# Patient Record
Sex: Male | Born: 1955 | Race: White | Hispanic: Refuse to answer | Marital: Married | Smoking: Never smoker
Health system: Southern US, Community
[De-identification: ages and names within clinical notes are randomized; demographics above are authoritative.]

## PROBLEM LIST (undated history)

## (undated) DIAGNOSIS — F419 Anxiety disorder, unspecified: Secondary | ICD-10-CM

## (undated) DIAGNOSIS — F410 Panic disorder [episodic paroxysmal anxiety] without agoraphobia: Secondary | ICD-10-CM

## (undated) DIAGNOSIS — C801 Malignant (primary) neoplasm, unspecified: Secondary | ICD-10-CM

## (undated) DIAGNOSIS — I10 Essential (primary) hypertension: Secondary | ICD-10-CM

## (undated) HISTORY — PX: TONSILLECTOMY: SUR1361

---

## 2006-01-05 ENCOUNTER — Ambulatory Visit: Payer: Self-pay | Admitting: Otolaryngology

## 2006-01-15 ENCOUNTER — Ambulatory Visit: Payer: Self-pay | Admitting: Otolaryngology

## 2006-01-16 ENCOUNTER — Ambulatory Visit: Payer: Self-pay | Admitting: Oncology

## 2006-01-17 ENCOUNTER — Ambulatory Visit: Payer: Self-pay | Admitting: Otolaryngology

## 2006-01-23 ENCOUNTER — Ambulatory Visit: Payer: Self-pay | Admitting: Oncology

## 2006-01-30 ENCOUNTER — Ambulatory Visit: Payer: Self-pay | Admitting: Surgery

## 2006-02-13 ENCOUNTER — Ambulatory Visit: Payer: Self-pay | Admitting: Orthopaedic Surgery

## 2006-02-15 ENCOUNTER — Ambulatory Visit: Payer: Self-pay | Admitting: Oncology

## 2006-03-18 ENCOUNTER — Ambulatory Visit: Payer: Self-pay | Admitting: Oncology

## 2006-04-03 ENCOUNTER — Ambulatory Visit: Payer: Self-pay | Admitting: Oncology

## 2006-04-18 ENCOUNTER — Ambulatory Visit: Payer: Self-pay | Admitting: Oncology

## 2006-05-18 ENCOUNTER — Ambulatory Visit: Payer: Self-pay | Admitting: Oncology

## 2006-06-18 ENCOUNTER — Ambulatory Visit: Payer: Self-pay | Admitting: Oncology

## 2006-07-18 ENCOUNTER — Ambulatory Visit: Payer: Self-pay | Admitting: Oncology

## 2006-08-14 ENCOUNTER — Ambulatory Visit: Payer: Self-pay | Admitting: Oncology

## 2006-08-27 ENCOUNTER — Ambulatory Visit: Payer: Self-pay | Admitting: Oncology

## 2006-09-18 ENCOUNTER — Ambulatory Visit: Payer: Self-pay | Admitting: Oncology

## 2006-10-28 ENCOUNTER — Ambulatory Visit: Payer: Self-pay | Admitting: Radiation Oncology

## 2006-11-17 ENCOUNTER — Ambulatory Visit: Payer: Self-pay | Admitting: Radiation Oncology

## 2006-11-25 ENCOUNTER — Ambulatory Visit: Payer: Self-pay | Admitting: Oncology

## 2006-12-17 ENCOUNTER — Ambulatory Visit: Payer: Self-pay | Admitting: Radiation Oncology

## 2006-12-17 ENCOUNTER — Ambulatory Visit: Payer: Self-pay | Admitting: Oncology

## 2007-02-16 ENCOUNTER — Ambulatory Visit: Payer: Self-pay | Admitting: Oncology

## 2007-02-26 ENCOUNTER — Ambulatory Visit: Payer: Self-pay | Admitting: Oncology

## 2007-03-01 ENCOUNTER — Ambulatory Visit: Payer: Self-pay | Admitting: Oncology

## 2007-03-19 ENCOUNTER — Ambulatory Visit: Payer: Self-pay | Admitting: Oncology

## 2007-05-19 ENCOUNTER — Ambulatory Visit: Payer: Self-pay | Admitting: Radiation Oncology

## 2007-05-20 ENCOUNTER — Ambulatory Visit: Payer: Self-pay | Admitting: Radiation Oncology

## 2007-06-19 ENCOUNTER — Ambulatory Visit: Payer: Self-pay | Admitting: Radiation Oncology

## 2007-08-09 ENCOUNTER — Ambulatory Visit: Payer: Self-pay | Admitting: Unknown Physician Specialty

## 2007-08-09 HISTORY — PX: ESOPHAGOGASTRODUODENOSCOPY: SHX1529

## 2007-08-09 HISTORY — PX: COLONOSCOPY: SHX174

## 2007-08-16 ENCOUNTER — Ambulatory Visit: Payer: Self-pay | Admitting: Oncology

## 2007-08-19 ENCOUNTER — Ambulatory Visit: Payer: Self-pay | Admitting: Oncology

## 2007-08-23 ENCOUNTER — Ambulatory Visit: Payer: Self-pay | Admitting: Oncology

## 2007-08-25 IMAGING — CT NM PET TUM IMG RESTAG (PS) SKULL BASE T - THIGH
1 of 5 series · 1 of 25 positions shown · non-contrast
Comparison: none

REASON FOR EXAM: tonsil CA
COMMENTS:

[Series 3: ct wb fusion · axial · 4.0mm · 0.98mm/px · 1 of 488 slices shown]
[im 443/488  brain]
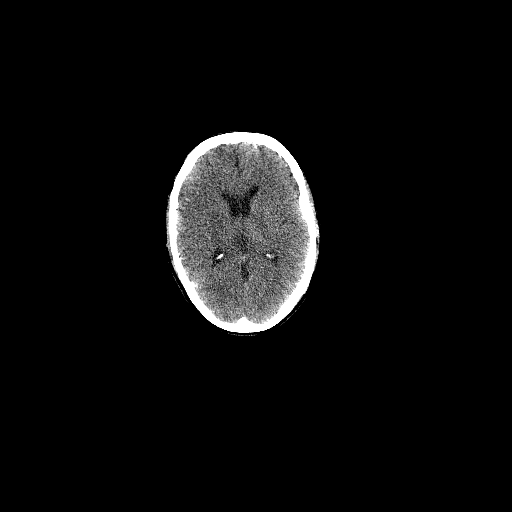

[1 of 25 positions shown; findings below may reference images not displayed]

PROCEDURE:     PET - PET/CT RESTG HEAD/NECK CA  - February 26, 2007 [DATE]

RESULT:     The patient has a history of tonsillar malignancy. The patient
is undergoing restaging. The patient received 12.58 mCi of fluorine 18
labeled fluorodeoxyglucose for this study. Whole-body images as well as
views of the head and neck only were obtained. Co-registration with
noncontrast CT images is performed as well.

Activity over the chest, abdomen, and pelvis is normal. There are no
findings to suggest metastatic disease to these portions of the body. Along
the anterior aspect of the floor of the mouth just along the inner surface
of the mandible there is mildly increased uptake that is relatively
symmetric. This is unchanged in appearance from the prior study. Increased
uptake along the LEFT aspect of the floor of mouth and more posteriorly in
the region of the submandibular glands seen on the prior study is not
evident today. No abnormal uptake within cervical or jugular lymph nodes is
identified.  A tiny amount of uptake in the region of the vocal cords is
present and felt to be related to phonation. No abnormality in the
nasopharynx is seen. The palatine tonsillar region appears normal as does
the region of the lingual tonsils.
IMPRESSION: I do not see findings suspicious for recurrent malignancy. The mildly
increased activity along the inner aspect of the mandible is nonspecific,
not greatly changed from the prior study, and is relatively symmetric from
RIGHT to LEFT. Other areas of mildly increased uptake demonstrated on the
prior study are no longer evident.

## 2007-09-09 ENCOUNTER — Ambulatory Visit: Payer: Self-pay | Admitting: Unknown Physician Specialty

## 2007-09-09 HISTORY — PX: FLEXIBLE SIGMOIDOSCOPY: SHX5431

## 2007-09-19 ENCOUNTER — Ambulatory Visit: Payer: Self-pay | Admitting: Oncology

## 2008-01-17 ENCOUNTER — Ambulatory Visit: Payer: Self-pay | Admitting: Oncology

## 2008-03-18 ENCOUNTER — Ambulatory Visit: Payer: Self-pay | Admitting: Oncology

## 2008-03-29 ENCOUNTER — Ambulatory Visit: Payer: Self-pay | Admitting: Oncology

## 2008-04-18 ENCOUNTER — Ambulatory Visit: Payer: Self-pay | Admitting: Oncology

## 2008-05-18 ENCOUNTER — Ambulatory Visit: Payer: Self-pay | Admitting: Oncology

## 2008-05-29 ENCOUNTER — Ambulatory Visit: Payer: Self-pay | Admitting: Oncology

## 2008-06-18 ENCOUNTER — Ambulatory Visit: Payer: Self-pay | Admitting: Oncology

## 2008-08-17 ENCOUNTER — Ambulatory Visit: Payer: Self-pay | Admitting: Oncology

## 2008-08-18 ENCOUNTER — Ambulatory Visit: Payer: Self-pay | Admitting: Oncology

## 2008-08-21 ENCOUNTER — Ambulatory Visit: Payer: Self-pay | Admitting: Oncology

## 2008-09-18 ENCOUNTER — Ambulatory Visit: Payer: Self-pay | Admitting: Oncology

## 2009-02-20 ENCOUNTER — Ambulatory Visit: Payer: Self-pay | Admitting: Oncology

## 2009-03-18 ENCOUNTER — Ambulatory Visit: Payer: Self-pay | Admitting: Oncology

## 2009-06-05 DIAGNOSIS — C4491 Basal cell carcinoma of skin, unspecified: Secondary | ICD-10-CM

## 2009-06-05 HISTORY — DX: Basal cell carcinoma of skin, unspecified: C44.91

## 2009-07-18 ENCOUNTER — Ambulatory Visit: Payer: Self-pay | Admitting: Oncology

## 2009-07-23 ENCOUNTER — Ambulatory Visit: Payer: Self-pay | Admitting: Oncology

## 2009-07-25 ENCOUNTER — Ambulatory Visit: Payer: Self-pay | Admitting: Oncology

## 2009-08-18 ENCOUNTER — Ambulatory Visit: Payer: Self-pay | Admitting: Oncology

## 2010-01-16 ENCOUNTER — Ambulatory Visit: Payer: Self-pay | Admitting: Oncology

## 2010-01-21 ENCOUNTER — Ambulatory Visit: Payer: Self-pay | Admitting: Oncology

## 2010-02-15 ENCOUNTER — Ambulatory Visit: Payer: Self-pay | Admitting: Oncology

## 2010-08-05 ENCOUNTER — Ambulatory Visit: Payer: Self-pay | Admitting: Oncology

## 2010-08-18 ENCOUNTER — Ambulatory Visit: Payer: Self-pay | Admitting: Oncology

## 2011-01-29 ENCOUNTER — Ambulatory Visit: Payer: Self-pay | Admitting: Oncology

## 2011-01-30 ENCOUNTER — Ambulatory Visit: Payer: Self-pay | Admitting: Oncology

## 2011-02-16 ENCOUNTER — Ambulatory Visit: Payer: Self-pay | Admitting: Oncology

## 2011-06-27 ENCOUNTER — Ambulatory Visit: Payer: Self-pay | Admitting: Internal Medicine

## 2011-07-28 ENCOUNTER — Ambulatory Visit: Payer: Self-pay | Admitting: Oncology

## 2011-08-19 ENCOUNTER — Ambulatory Visit: Payer: Self-pay | Admitting: Oncology

## 2012-02-02 ENCOUNTER — Ambulatory Visit: Payer: Self-pay | Admitting: Emergency Medicine

## 2014-04-15 DIAGNOSIS — I10 Essential (primary) hypertension: Secondary | ICD-10-CM | POA: Insufficient documentation

## 2014-04-15 DIAGNOSIS — E039 Hypothyroidism, unspecified: Secondary | ICD-10-CM | POA: Insufficient documentation

## 2014-04-15 DIAGNOSIS — E7800 Pure hypercholesterolemia, unspecified: Secondary | ICD-10-CM | POA: Insufficient documentation

## 2014-04-15 DIAGNOSIS — F5104 Psychophysiologic insomnia: Secondary | ICD-10-CM | POA: Insufficient documentation

## 2015-09-26 DIAGNOSIS — Z8589 Personal history of malignant neoplasm of other organs and systems: Secondary | ICD-10-CM | POA: Insufficient documentation

## 2015-09-26 DIAGNOSIS — Z0000 Encounter for general adult medical examination without abnormal findings: Secondary | ICD-10-CM | POA: Insufficient documentation

## 2016-07-14 ENCOUNTER — Other Ambulatory Visit: Payer: Self-pay | Admitting: Medical

## 2016-07-14 ENCOUNTER — Ambulatory Visit
Admission: RE | Admit: 2016-07-14 | Discharge: 2016-07-14 | Disposition: A | Discharge status: Home / Self Care | Payer: BLUE CROSS/BLUE SHIELD | Source: Ambulatory Visit | Attending: Medical | Admitting: Medical

## 2016-07-14 DIAGNOSIS — R52 Pain, unspecified: Secondary | ICD-10-CM

## 2017-02-11 ENCOUNTER — Encounter: Payer: Self-pay | Admitting: Medical

## 2017-02-11 ENCOUNTER — Ambulatory Visit: Payer: Self-pay | Admitting: Medical

## 2017-02-11 VITALS — BP 146/82 | HR 68 | Temp 97.6°F | Resp 16 | Ht 69.0 in | Wt 177.2 lb

## 2017-02-11 DIAGNOSIS — H1013 Acute atopic conjunctivitis, bilateral: Secondary | ICD-10-CM

## 2017-02-11 MED ORDER — OLOPATADINE HCL 0.1 % OP SOLN: 1.0000 [drp] | Freq: Two times a day (BID) | OPHTHALMIC | 1 refills | Status: DC

## 2017-02-11 NOTE — Progress Notes (Signed)
   Subjective:    Patient ID: Jerome Joseph, male    DOB: Mar 15, 1956, 61 y.o.   MRN: [id number]  HPI 61 yo trimming bushes this weekend , now has clear, itchy eyes bilaterally and runny nose. No cough.  Does not wear contacts.   Review of Systems  Constitutional: Negative for chills and fever.  HENT: Positive for congestion. Negative for ear pain and sore throat.   Respiratory: Negative for cough.   Cardiovascular: Negative for chest pain.  Gastrointestinal: Negative for abdominal pain.  Skin: Negative for rash.       Objective:   Physical Exam  Constitutional: He appears well-developed and well-nourished.  HENT:  Head: Normocephalic and atraumatic.  Eyes: EOM and lids are normal. Pupils are equal, round, and reactive to light. Right eye exhibits no discharge and no exudate. Left eye exhibits no discharge and no exudate. Right conjunctiva is injected. Left conjunctiva is injected. No scleral icterus.  Fundoscopic exam:      The right eye shows red reflex.       The left eye shows red reflex.  Neck: Normal range of motion.  Lymphadenopathy:    He has no cervical adenopathy.  Nursing note and vitals reviewed.         Assessment & Plan:  Allergic conjunctivitis e-prescribed Patanol 0. 1 % apply one drop to each eye twice daily 6ml  1 refill. Return to the clinic in 3-5 days if not improving. Patient recommends [email or web address] for reading glasses.

## 2017-05-28 ENCOUNTER — Ambulatory Visit: Payer: Self-pay | Admitting: Adult Health

## 2017-05-28 VITALS — BP 120/80 | HR 80 | Temp 98.7°F | Resp 16 | Wt 175.0 lb

## 2017-05-28 DIAGNOSIS — L989 Disorder of the skin and subcutaneous tissue, unspecified: Secondary | ICD-10-CM

## 2017-05-28 NOTE — Patient Instructions (Signed)
Actinic Keratosis An actinic keratosis is a precancerous growth on the skin. This means that it could develop into skin cancer if it is not treated. About 1% of these growths (actinic keratoses) turn into skin cancer within one year if they are not treated. It is important to have all of these growths evaluated to determine the best treatment approach. What are the causes? This condition is caused by getting too much ultraviolet (UV) radiation from the sun or other UV light sources. What increases the risk? The following factors may make you more likely to develop this condition:  Having light-colored skin and blue eyes.  Having blonde or red hair.  Spending a lot of time in the sun.  Inadequate skin protection when outdoors. This may include: ? Not using sunscreen properly. ? Not covering up skin that is exposed to sunlight.  Aging. The risk of developing an actinic keratosis increases with age.  What are the signs or symptoms? Actinic keratoses look like scaly, rough spots of skin.They can be as small as a pinhead or as big as a quarter. They may itch, hurt, or feel sensitive. In most cases, the growths become red. In some cases, they may be skin-colored, light tan, dark tan, pink, or a combination of any of these colors. There may be a small piece of pink or gray skin (skin tag) growing from the actinic keratosis. In some cases, it may be easier to notice actinic keratoses by feeling them, rather than seeing them. Actinic keratoses appear most often on areas of skin that get a lot of sun exposure, including the scalp, face, ears, lips, upper back, forearms, and the backs of the hands. Sometimes, actinic keratoses disappear, but many reappear a few days to a few weeks later. How is this diagnosed? This condition is usually diagnosed with a physical exam. A tissue sample may be removed from the actinic keratosis and examined under a microscope (biopsy). How is this treated?  Treatment for  this condition may include:  Scraping off the actinic keratosis (curettage).  Freezing the actinic keratosis with liquid nitrogen (cryosurgery). This causes the growth to eventually fall off the skin.  Applying medicated creams or gels to destroy the cells in the growth.  Applying chemicals to the actinic keratosis to make the outer layers of skin peel off (chemical peel).  Photodynamic therapy. In this procedure, medicated cream is applied to the actinic keratosis. This cream increases your skin's sensitivity to light. Then, a strong light is aimed at the actinic keratosis to destroy cells in the growth.  Follow these instructions at home: Skin care  Apply cool, wet cloths (cool compresses) to the affected areas.  Do not scratch your skin.  Check your skin regularly for any growths, especially growths that: ? Start to itch or bleed. ? Change in size, shape, or color. Caring for the treated area  Keep the treated area clean and dry as told by your health care provider.  Do not apply any medicine, cream, or lotion to the treated area unless your health care provider tells you to do that.  Do not pick at blisters or try to break them open. This can cause infection and scarring.  If you have red or irritated skin after treatment, follow instructions from your health care provider about how to take care of the treated area. Make sure you: ? Wash your hands with soap and water before you change your bandage (dressing). If soap and water are not available, use  hand sanitizer. ? Change your dressing as told by your health care provider.  If you have red or irritated skin after treatment, check your treated area every day for signs of infection. Check for: ? Swelling, pain, or more redness. ? Fluid or blood. ? Warmth. ? Pus or a bad smell. General instructions  Take over-the-counter and prescription medicines only as told by your health care provider.  Return to your normal  activities as told by your health care provider. Ask your health care provider what activities are safe for you.  Do not use any tobacco products, such as cigarettes, chewing tobacco, and e-cigarettes. If you need help quitting, ask your health care provider.  Have a skin exam done every year by a health care provider who is a skin conditions specialist (dermatologist).  Keep all follow-up visits as told by your health care provider. This is important. How is this prevented?  Do not get sunburns.  Try to avoid the sun between 10:00 a.m. and 4:00 p.m. This is when the UV light is the strongest.  Use a sunscreen or sunblock with SPF 30 (sun protection factor 30) or greater.  Apply sunscreen before you are exposed to sunlight, and reapply periodically as often as directed by the instructions on the sunscreen container.  Always wear sunglasses that have UV protection, and always wear hats and clothing to protect your skin from sunlight.  When possible, avoid medicines that increase your sensitivity to sunlight. These include: ? Certain antibiotic medicines. ? Certain water pills (diuretics). ? Certain prescription medicines that are used to treat acne (retinoids).  Do not use tanning beds or other indoor tanning devices. Contact a health care provider if:  You notice any changes or new growths on your skin.  You have swelling, pain, or more redness around your treated area.  You have fluid or blood coming from your treated area.  Your treated area feels warm to the touch.  You have pus or a bad smell coming from your treated area.  You have a fever.  You have a blister that becomes large and painful. This information is not intended to replace advice given to you by your health care provider. Make sure you discuss any questions you have with your health care provider. Document Released: 10/31/2008 Document Revised: 04/04/2016 Document Reviewed: 04/14/2015 Elsevier Interactive  Patient Education  2018 [employer]  

## 2017-05-28 NOTE — Progress Notes (Signed)
Subjective:     Patient ID: Jerome Joseph, male   DOB: [birth year], 61 y.o.   MRN: [id number]  HPI Patient is a 61 year old male in no acute distress who presents to the clinic with a lesion left upper arm x 2 weeks. Denies any itching burning. Denies any change in size.Marland Kitchen He reports it is only sore if he touches it.  He denies any other new lesions or changing lesions.  He is a patient of Dr. Drema Dallas for dermatology for atinic keratosis.  He denies any other symptoms or concerns.   Blood pressure 120/80, pulse 80, temperature 98.7 F (37.1 C), resp. rate 16, weight 175 lb (79.4 kg), SpO2 97 %.  Patient Active Problem List   Diagnosis Date Noted  . Health care maintenance 09/26/2015  . History of squamous cell carcinoma 09/26/2015  . Essential hypertension 04/15/2014  . Hypercholesterolemia 04/15/2014  . Hypothyroid 04/15/2014    Kirk Ruths, MD - sees him regularly for check ups per patient.    Current Outpatient Prescriptions:  .  amLODipine (NORVASC) 10 MG tablet, Take 10 mg by mouth., Disp: , Rfl:  .  aspirin EC 81 MG tablet, Take 81 mg by mouth., Disp: , Rfl:  .  clorazepate (TRANXENE) 7.5 MG tablet, Take 7.5 mg by mouth., Disp: , Rfl:  .  levothyroxine (SYNTHROID, LEVOTHROID) 88 MCG tablet, Take by mouth., Disp: , Rfl:  .  losartan-hydrochlorothiazide (HYZAAR) 100-12.5 MG tablet, , Disp: , Rfl: 2 .  omeprazole (PRILOSEC) 40 MG capsule, , Disp: , Rfl: 2 .  ranitidine (ZANTAC) 300 MG tablet, TAKE 1 T BY MOUTH TWICE DAILY, Disp: , Rfl: 0 .  sildenafil (REVATIO) 20 MG tablet, TAKE 2 TO 5 TABLETS BY MOUTH EVERY DAY AS NEEDED, Disp: , Rfl: 11 .  Cholecalciferol (VITAMIN D3) 1000 units CAPS, Take by mouth., Disp: , Rfl:  .  olopatadine (PATANOL) 0.1 % ophthalmic solution, Place 1 drop into both eyes 2 (two) times daily. (Patient not taking: Reported on 05/28/2017), Disp: 5 mL, Rfl: 1   Review of Systems  Constitutional: Negative.   HENT: Negative.   Respiratory:  Negative.   Cardiovascular: Negative.   Gastrointestinal: Negative.   Endocrine: Negative.   Genitourinary: Negative.   Musculoskeletal: Negative.   Skin: Positive for color change. Negative for pallor, rash and wound.       New skin lesion x 2 weeks as documented in HPI per patient report.    Allergic/Immunologic: Negative.   Neurological: Negative.   Hematological: Negative.   Psychiatric/Behavioral: Negative.        Objective:   Physical Exam  Constitutional: He is oriented to person, place, and time. He appears well-developed and well-nourished. No distress.  HENT:  Head: Normocephalic and atraumatic.  Eyes: Pupils are equal, round, and reactive to light. Conjunctivae and EOM are normal.  Neck: Normal range of motion. Neck supple. No JVD present. No tracheal deviation present. No thyromegaly present.  Cardiovascular: Normal rate, regular rhythm, normal heart sounds and intact distal pulses.  Exam reveals no gallop and no friction rub.   No murmur heard. Pulmonary/Chest: Effort normal and breath sounds normal. No stridor. No respiratory distress. He has no wheezes. He has no rales. He exhibits no tenderness.  Abdominal: Soft. Bowel sounds are normal.  Musculoskeletal: Normal range of motion.  Lymphadenopathy:    He has no cervical adenopathy.  Neurological: He is alert and oriented to person, place, and time. He has normal reflexes.  Skin: Skin  is warm and dry. No rash noted. He is not diaphoretic. No erythema. No pallor.  Back left of upper arm with 0.5 x 0.3 cm raised pink lesion with dry rough patchy skin in center. No drainage, no pain with palpation. No erythema. Area consistent with actinic keratosis.   Psychiatric: He has a normal mood and affect. His behavior is normal. Judgment and thought content normal.  Vitals reviewed.      Assessment:     Skin lesion of left arm actinic keratosis rule out squamous cell - needs to see dermatologist.  He has a history of  squamous cell      Plan:     1. Likely Actinic Keratosis left upper arm as documented above however can not exclude squamous cell under the dry skin.  Patient reports he is a patient of dermatologist Dr. Nehemiah Massed ad will call him today for an appointment today within the next two weeks from today's date 05/28/16. HE WILL SEE HIS DERMATOLOGIST WITHIN TWO WEEKS AND CALL OFFICE IF UNABLE TO OBTAIN AN APPOINTMENT.   Return to clinic at any time  if any new symptoms change, worsen or do not improve.  If any emergent symptoms call 911 at anytime. Patient verbalized above understanding of all instructions and denies any further questions at this time.    Patient verbalized understanding of instructions and denies any further questions at this time.

## 2017-08-20 ENCOUNTER — Encounter: Payer: Self-pay | Admitting: *Deleted

## 2017-08-21 ENCOUNTER — Ambulatory Visit: Payer: BLUE CROSS/BLUE SHIELD | Admitting: Anesthesiology

## 2017-08-21 ENCOUNTER — Ambulatory Visit
Admission: RE | Admit: 2017-08-21 | Discharge: 2017-08-21 | Disposition: A | Discharge status: Home / Self Care | Payer: BLUE CROSS/BLUE SHIELD | Source: Ambulatory Visit | Attending: Unknown Physician Specialty | Admitting: Unknown Physician Specialty

## 2017-08-21 ENCOUNTER — Other Ambulatory Visit: Payer: Self-pay

## 2017-08-21 ENCOUNTER — Encounter
Admission: RE | Disposition: A | Discharge status: Home / Self Care | Payer: Self-pay | Source: Ambulatory Visit | Attending: Unknown Physician Specialty

## 2017-08-21 ENCOUNTER — Encounter: Payer: Self-pay | Admitting: *Deleted

## 2017-08-21 DIAGNOSIS — Z79899 Other long term (current) drug therapy: Secondary | ICD-10-CM | POA: Diagnosis not present

## 2017-08-21 DIAGNOSIS — I10 Essential (primary) hypertension: Secondary | ICD-10-CM | POA: Diagnosis not present

## 2017-08-21 DIAGNOSIS — F419 Anxiety disorder, unspecified: Secondary | ICD-10-CM | POA: Diagnosis not present

## 2017-08-21 DIAGNOSIS — Z85818 Personal history of malignant neoplasm of other sites of lip, oral cavity, and pharynx: Secondary | ICD-10-CM | POA: Insufficient documentation

## 2017-08-21 DIAGNOSIS — Z1211 Encounter for screening for malignant neoplasm of colon: Secondary | ICD-10-CM | POA: Diagnosis not present

## 2017-08-21 DIAGNOSIS — K5730 Diverticulosis of large intestine without perforation or abscess without bleeding: Secondary | ICD-10-CM | POA: Diagnosis not present

## 2017-08-21 DIAGNOSIS — Z85828 Personal history of other malignant neoplasm of skin: Secondary | ICD-10-CM | POA: Diagnosis not present

## 2017-08-21 DIAGNOSIS — K640 First degree hemorrhoids: Secondary | ICD-10-CM | POA: Diagnosis not present

## 2017-08-21 DIAGNOSIS — K219 Gastro-esophageal reflux disease without esophagitis: Secondary | ICD-10-CM | POA: Diagnosis not present

## 2017-08-21 DIAGNOSIS — Z7982 Long term (current) use of aspirin: Secondary | ICD-10-CM | POA: Insufficient documentation

## 2017-08-21 HISTORY — DX: Anxiety disorder, unspecified: F41.9

## 2017-08-21 HISTORY — PX: COLONOSCOPY WITH PROPOFOL: SHX5780

## 2017-08-21 HISTORY — DX: Essential (primary) hypertension: I10

## 2017-08-21 HISTORY — PX: ESOPHAGOGASTRODUODENOSCOPY (EGD) WITH PROPOFOL: SHX5813

## 2017-08-21 HISTORY — DX: Panic disorder (episodic paroxysmal anxiety): F41.0

## 2017-08-21 HISTORY — DX: Malignant (primary) neoplasm, unspecified: C80.1

## 2017-08-21 SURGERY — COLONOSCOPY WITH PROPOFOL
Anesthesia: General

## 2017-08-21 MED ORDER — MIDAZOLAM HCL 2 MG/2ML IJ SOLN
INTRAMUSCULAR | Status: AC
  Filled 2017-08-21: qty 2

## 2017-08-21 MED ORDER — SODIUM CHLORIDE 0.9 % IV SOLN
INTRAVENOUS | Status: DC
  Administered 2017-08-21: 07:00:00 via INTRAVENOUS

## 2017-08-21 MED ORDER — LIDOCAINE HCL (CARDIAC) 20 MG/ML IV SOLN
INTRAVENOUS | Status: DC | PRN
  Administered 2017-08-21: 30 mg via INTRAVENOUS

## 2017-08-21 MED ORDER — EPHEDRINE SULFATE 50 MG/ML IJ SOLN
INTRAMUSCULAR | Status: AC
  Filled 2017-08-21: qty 1

## 2017-08-21 MED ORDER — MIDAZOLAM HCL 2 MG/2ML IJ SOLN
INTRAMUSCULAR | Status: DC | PRN
  Administered 2017-08-21: 2 mg via INTRAVENOUS

## 2017-08-21 MED ORDER — SODIUM CHLORIDE 0.9 % IV SOLN: INTRAVENOUS | Status: DC

## 2017-08-21 MED ORDER — FENTANYL CITRATE (PF) 100 MCG/2ML IJ SOLN
INTRAMUSCULAR | Status: DC | PRN
  Administered 2017-08-21: 50 ug via INTRAVENOUS

## 2017-08-21 MED ORDER — PROPOFOL 500 MG/50ML IV EMUL
INTRAVENOUS | Status: AC
  Filled 2017-08-21: qty 50

## 2017-08-21 MED ORDER — BUTAMBEN-TETRACAINE-BENZOCAINE 2-2-14 % EX AERO
INHALATION_SPRAY | CUTANEOUS | Status: AC
  Filled 2017-08-21: qty 5

## 2017-08-21 MED ORDER — LIDOCAINE HCL (PF) 2 % IJ SOLN
INTRAMUSCULAR | Status: AC
  Filled 2017-08-21: qty 10

## 2017-08-21 MED ORDER — PHENYLEPHRINE HCL 10 MG/ML IJ SOLN
INTRAMUSCULAR | Status: DC | PRN
  Administered 2017-08-21 (×4): 50 ug via INTRAVENOUS

## 2017-08-21 MED ORDER — EPHEDRINE SULFATE 50 MG/ML IJ SOLN
INTRAMUSCULAR | Status: DC | PRN
  Administered 2017-08-21 (×2): 10 mg via INTRAVENOUS

## 2017-08-21 MED ORDER — FENTANYL CITRATE (PF) 100 MCG/2ML IJ SOLN
INTRAMUSCULAR | Status: AC
  Filled 2017-08-21: qty 2

## 2017-08-21 MED ORDER — PROPOFOL 500 MG/50ML IV EMUL
INTRAVENOUS | Status: DC | PRN
  Administered 2017-08-21: 120 ug/kg/min via INTRAVENOUS

## 2017-08-21 NOTE — Anesthesia Postprocedure Evaluation (Signed)
Anesthesia Post Note  Patient: Jerome Joseph  Procedure(s) Performed: COLONOSCOPY WITH PROPOFOL (N/A ) ESOPHAGOGASTRODUODENOSCOPY (EGD) WITH PROPOFOL (N/A )  Patient location during evaluation: Endoscopy Anesthesia Type: General Level of consciousness: awake and alert Pain management: pain level controlled Vital Signs Assessment: post-procedure vital signs reviewed and stable Respiratory status: spontaneous breathing and respiratory function stable Cardiovascular status: stable Anesthetic complications: no     Last Vitals:  Vitals:   08/21/17 0707 08/21/17 0806  BP: (!) 143/86 (!) 86/47  Pulse: 81 95  Resp: 16 (!) 24  Temp: (!) 35.9 C (!) 35.9 C  SpO2: 100% 100%    Last Pain:  Vitals:   08/21/17 0806  TempSrc: Tympanic                 KEPHART,WILLIAM K

## 2017-08-21 NOTE — Anesthesia Post-op Follow-up Note (Signed)
Anesthesia QCDR form completed.        

## 2017-08-21 NOTE — Anesthesia Procedure Notes (Signed)
Performed by: Vaughan Sine Pre-anesthesia Checklist: Patient identified, Suction available, Patient being monitored, Emergency Drugs available and Timeout performed Patient Re-evaluated:Patient Re-evaluated prior to induction Oxygen Delivery Method: Nasal cannula Preoxygenation: Pre-oxygenation with 100% oxygen Induction Type: IV induction Airway Equipment and Method: Bite block Placement Confirmation: CO2 detector and positive ETCO2

## 2017-08-21 NOTE — Op Note (Signed)
[facility] Patient Name: Jerome Joseph Procedure Date: 08/21/2017 7:07 AM MRN: [id number] Account #: [id number] Date of Birth: [birth year] Admit Type: Outpatient Age: 62 Room: [facility] ENDO ROOM 1 Gender: Male Note Status: Finalized Procedure:            Upper GI endoscopy Indications:          Follow up tonsil cancer. Providers:            Manya Silvas, MD Referring MD:         Ocie Cornfield. Ouida Sills MD, MD (Referring MD) Medicines:            Propofol per Anesthesia Complications:        No immediate complications. Procedure:            Pre-Anesthesia Assessment:                       - After reviewing the risks and benefits, the patient                        was deemed in satisfactory condition to undergo the                        procedure.                       After obtaining informed consent, the endoscope was                        passed under direct vision. Throughout the procedure,                        the patient's blood pressure, pulse, and oxygen                        saturations were monitored continuously. The Endoscope                        was introduced through the mouth, and advanced to the                        second part of duodenum. The upper GI endoscopy was                        accomplished without difficulty. The patient tolerated                        the procedure well. Findings:      The examined esophagus was normal. GEJ 40cm. The laryngeal area was       normal, no nodules or masses or tumors anywhere.      The entire examined stomach was normal.      The examined duodenum was normal. Impression:           - Normal esophagus.                       - Normal stomach.                       - Normal examined duodenum.                       -  No specimens collected. Recommendation:       - Perform a colonoscopy as previously scheduled. Manya Silvas, MD 08/21/2017 7:46:46 AM This report has been signed  electronically. Number of Addenda: 0 Note Initiated On: 08/21/2017 7:07 AM      [facility]

## 2017-08-21 NOTE — Op Note (Signed)
[facility] Patient Name: Jerome Joseph Procedure Date: 08/21/2017 7:07 AM MRN: [id number] Account #: [id number] Date of Birth: [birth year] Admit Type: Outpatient Age: 62 Room: [facility] ENDO ROOM 1 Gender: Male Note Status: Finalized Procedure:            Colonoscopy Indications:          Screening for colorectal malignant neoplasm Providers:            Manya Silvas, MD Referring MD:         Ocie Cornfield. Ouida Sills MD, MD (Referring MD) Medicines:            Propofol per Anesthesia Complications:        No immediate complications. Procedure:            Pre-Anesthesia Assessment:                       - After reviewing the risks and benefits, the patient                        was deemed in satisfactory condition to undergo the                        procedure.                       After obtaining informed consent, the colonoscope was                        passed under direct vision. Throughout the procedure,                        the patient's blood pressure, pulse, and oxygen                        saturations were monitored continuously. The                        Colonoscope was introduced through the anus and                        advanced to the the cecum, identified by appendiceal                        orifice and ileocecal valve. The colonoscopy was                        performed without difficulty. The patient tolerated the                        procedure well. The quality of the bowel preparation                        was excellent. Findings:      Multiple medium-mouthed diverticula were found in the sigmoid colon and       descending colon.      Internal hemorrhoids were found during endoscopy. The hemorrhoids were       small and Grade I (internal hemorrhoids that do not prolapse).      The exam was otherwise without abnormality. Impression:           - Diverticulosis in the sigmoid  colon and in the   descending colon.                       - Internal hemorrhoids.                       - The examination was otherwise normal.                       - No specimens collected. Recommendation:       - Repeat colonoscopy in 10 years for screening purposes. Manya Silvas, MD 08/21/2017 8:06:14 AM This report has been signed electronically. Number of Addenda: 0 Note Initiated On: 08/21/2017 7:07 AM Scope Withdrawal Time: 0 hours 9 minutes 48 seconds  Total Procedure Duration: 0 hours 14 minutes 54 seconds       [facility]

## 2017-08-21 NOTE — Transfer of Care (Signed)
Immediate Anesthesia Transfer of Care Note  Patient: Jerome Joseph  Procedure(s) Performed: COLONOSCOPY WITH PROPOFOL (N/A ) ESOPHAGOGASTRODUODENOSCOPY (EGD) WITH PROPOFOL (N/A )  Patient Location: PACU  Anesthesia Type:General  Level of Consciousness: awake and sedated  Airway & Oxygen Therapy: Patient Spontanous Breathing and Patient connected to nasal cannula oxygen  Post-op Assessment: Report given to RN and Post -op Vital signs reviewed and stable  Post vital signs: Reviewed and stable  Last Vitals:  Vitals:   08/21/17 0707  BP: (!) 143/86  Pulse: 81  Resp: 16  Temp: (!) 35.9 C  SpO2: 100%    Last Pain:  Vitals:   08/21/17 0707  TempSrc: Tympanic         Complications: No apparent anesthesia complications

## 2017-08-21 NOTE — H&P (Signed)
Primary Care Physician:  Kirk Ruths, MD Primary Gastroenterologist:  Dr. Vira Agar  Pre-Procedure History & Physical: HPI:  Jerome Joseph is a 62 y.o. male is here for an endoscopy and colonoscopy.   Past Medical History:  Diagnosis Date  . Anxiety   . 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 Hypertension   . Panic attacks     Past Surgical History:  Procedure Laterality Date  . COLONOSCOPY  08/09/2007  . ESOPHAGOGASTRODUODENOSCOPY  08/09/2007  . FLEXIBLE SIGMOIDOSCOPY  09/09/2007  . TONSILLECTOMY      Prior to Admission medications   Medication Sig Start Date End Date Taking? Authorizing Provider  amLODipine (NORVASC) 10 MG tablet Take 10 mg by mouth. 02/20/15  Yes [provider]  aspirin EC 81 MG tablet Take 81 mg by mouth.   Yes [provider]  Cholecalciferol (VITAMIN D3) 1000 units CAPS Take by mouth.   Yes [provider]  clorazepate (TRANXENE) 7.5 MG tablet Take 7.5 mg by mouth. 09/26/15  Yes [provider]  levothyroxine (SYNTHROID, LEVOTHROID) 88 MCG tablet Take by mouth. 05/05/14  Yes [provider]  losartan-hydrochlorothiazide Konrad Penta) 100-12.5 MG tablet  02/10/17  Yes [provider]  omeprazole (PRILOSEC) 40 MG capsule  02/10/17  Yes [provider]  ranitidine (ZANTAC) 300 MG tablet TAKE 1 T BY MOUTH TWICE DAILY 12/02/16  Yes [provider]  olopatadine (PATANOL) 0.1 % ophthalmic solution Place 1 drop into both eyes 2 (two) times daily. Patient not taking: Reported on 05/28/2017 02/11/17   Daryll Drown R, PA-C  sildenafil (REVATIO) 20 MG tablet TAKE 2 TO 5 TABLETS BY MOUTH EVERY DAY AS NEEDED 01/15/17   [provider]    Allergies as of 03/25/2017 - Review Complete 02/11/2017  Allergen Reaction Noted  . Oxycodone-acetaminophen Itching 09/22/2014    History reviewed. No pertinent family history.  Social History   Socioeconomic History   . Marital status: Married    Spouse name: Not on file  . Number of children: Not on file  . Years of education: Not on file  . Highest education level: Not on file  Social Needs  . Financial resource strain: Not on file  . Food insecurity - worry: Not on file  . Food insecurity - inability: Not on file  . Transportation needs - medical: Not on file  . Transportation needs - non-medical: Not on file  Occupational History  . Not on file  Tobacco Use  . Smoking status: Never Smoker  . Smokeless tobacco: Never Used  Substance and Sexual Activity  . Alcohol use: Yes  . Drug use: No  . Sexual activity: Not on file  Other Topics Concern  . Not on file  Social History Narrative  . Not on file    Review of Systems: See HPI, otherwise negative ROS  Physical Exam: BP (!) 143/86   Pulse 81   Temp (!) 96.7 F (35.9 C) (Tympanic)   Resp 16   Ht 5\' 9"  (1.753 m)   Wt 80.7 kg (178 lb)   SpO2 100%   BMI 26.29 kg/m  General:   Alert,  pleasant and cooperative in NAD Head:  Normocephalic and atraumatic. Neck:  Supple; no masses or thyromegaly. Lungs:  Clear throughout to auscultation.    Heart:  Regular rate and rhythm. Abdomen:  Soft, nontender and nondistended. Normal bowel sounds, without guarding, and without rebound.   Neurologic:  Alert and  oriented x4;  grossly normal neurologically.  Impression/Plan: Jerome Joseph is here for an endoscopy and colonoscopy to be performed for screening colonoscopy and surveliance of tonsil cancer   Risks, benefits, limitations, and alternatives regarding  endoscopy and colonoscopy have been reviewed with the patient.  Questions have been answered.  All parties agreeable.   Gaylyn Cheers, MD  08/21/2017, 7:33 AM

## 2017-08-21 NOTE — Anesthesia Preprocedure Evaluation (Signed)
Anesthesia Evaluation  Patient identified by MRN, date of birth, ID band Patient awake    Reviewed: Allergy & Precautions, NPO status , Patient's Chart, lab work & pertinent test results  History of Anesthesia Complications Negative for: history of anesthetic complications  Airway Mallampati: II       Dental   Pulmonary neg sleep apnea, neg COPD,           Cardiovascular hypertension, Pt. on medications (-) Past MI and (-) CHF (-) dysrhythmias (-) Valvular Problems/Murmurs     Neuro/Psych neg Seizures Anxiety    GI/Hepatic Neg liver ROS, GERD  Medicated and Controlled,  Endo/Other  neg diabetesHypothyroidism   Renal/GU      Musculoskeletal   Abdominal   Peds  Hematology   Anesthesia Other Findings   Reproductive/Obstetrics                             Anesthesia Physical Anesthesia Plan  ASA: II  Anesthesia Plan: General   Post-op Pain Management:    Induction: Intravenous  PONV Risk Score and Plan: 2 and Propofol infusion and TIVA  Airway Management Planned: Nasal Cannula  Additional Equipment:   Intra-op Plan:   Post-operative Plan:   Informed Consent: I have reviewed the patients History and Physical, chart, labs and discussed the procedure including the risks, benefits and alternatives for the proposed anesthesia with the patient or authorized representative who has indicated his/her understanding and acceptance.     Plan Discussed with:   Anesthesia Plan Comments:         Anesthesia Quick Evaluation

## 2017-08-24 ENCOUNTER — Encounter: Payer: Self-pay | Admitting: Unknown Physician Specialty

## 2017-09-04 ENCOUNTER — Encounter: Payer: Self-pay | Admitting: Adult Health

## 2017-09-04 ENCOUNTER — Ambulatory Visit: Payer: Self-pay | Admitting: Adult Health

## 2017-09-04 VITALS — BP 124/80 | HR 92 | Temp 98.3°F | Resp 16 | Ht 69.0 in | Wt 178.0 lb

## 2017-09-04 DIAGNOSIS — H6501 Acute serous otitis media, right ear: Secondary | ICD-10-CM

## 2017-09-04 MED ORDER — AMOXICILLIN-POT CLAVULANATE 875-125 MG PO TABS: 1.0000 | ORAL_TABLET | Freq: Two times a day (BID) | ORAL | 0 refills | Status: DC

## 2017-09-04 NOTE — Patient Instructions (Signed)
Otitis Media, Adult Otitis media is redness, soreness, and puffiness (swelling) in the space just behind your eardrum (middle ear). It may be caused by allergies or infection. It often happens along with a cold. Follow these instructions at home:  Take your medicine as told. Finish it even if you start to feel better.  Only take over-the-counter or prescription medicines for pain, discomfort, or fever as told by your doctor.  Follow up with your doctor as told. Contact a doctor if:  You have otitis media only in one ear, or bleeding from your nose, or both.  You notice a lump on your neck.  You are not getting better in 3-5 days.  You feel worse instead of better. Get help right away if:  You have pain that is not helped with medicine.  You have puffiness, redness, or pain around your ear.  You get a stiff neck.  You cannot move part of your face (paralysis).  You notice that the bone behind your ear hurts when you touch it. This information is not intended to replace advice given to you by your health care provider. Make sure you discuss any questions you have with your health care provider. Document Released: 01/21/2008 Document Revised: 01/10/2016 Document Reviewed: 03/01/2013 Elsevier Interactive Patient Education  2017 [place], Adult The ears produce a substance called earwax that helps keep bacteria out of the ear and protects the skin in the ear canal. Occasionally, earwax can build up in the ear and cause discomfort or hearing loss. What increases the risk? This condition is more likely to develop in people who:  Are male.  Are elderly.  Naturally produce more earwax.  Clean their ears often with cotton swabs.  Use earplugs often.  Use in-ear headphones often.  Wear hearing aids.  Have narrow ear canals.  Have earwax that is overly thick or sticky.  Have eczema.  Are dehydrated.  Have excess hair in the ear canal.  What are  the signs or symptoms? Symptoms of this condition include:  Reduced or muffled hearing.  A feeling of fullness in the ear or feeling that the ear is plugged.  Fluid coming from the ear.  Ear pain.  Ear itch.  Ringing in the ear.  Coughing.  An obvious piece of earwax that can be seen inside the ear canal.  How is this diagnosed? This condition may be diagnosed based on:  Your symptoms.  Your medical history.  An ear exam. During the exam, your health care provider will look into your ear with an instrument called an otoscope.  You may have tests, including a hearing test. How is this treated? This condition may be treated by:  Using ear drops to soften the earwax.  Having the earwax removed by a health care provider. The health care provider may: ? Flush the ear with water. ? Use an instrument that has a loop on the end (curette). ? Use a suction device.  Surgery to remove the wax buildup. This may be done in severe cases.  Follow these instructions at home:  Take over-the-counter and prescription medicines only as told by your health care provider.  Do not put any objects, including cotton swabs, into your ear. You can clean the opening of your ear canal with a washcloth or facial tissue.  Follow instructions from your health care provider about cleaning your ears. Do not over-clean your ears.  Drink enough fluid to keep your urine clear or pale yellow.  This will help to thin the earwax.  Keep all follow-up visits as told by your health care provider. If earwax builds up in your ears often or if you use hearing aids, consider seeing your health care provider for routine, preventive ear cleanings. Ask your health care provider how often you should schedule your cleanings.  If you have hearing aids, clean them according to instructions from the manufacturer and your health care provider. Contact a health care provider if:  You have ear pain.  You develop a  fever.  You have blood, pus, or other fluid coming from your ear.  You have hearing loss.  You have ringing in your ears that does not go away.  Your symptoms do not improve with treatment.  You feel like the room is spinning (vertigo). Summary  Earwax can build up in the ear and cause discomfort or hearing loss.  The most common symptoms of this condition include reduced or muffled hearing and a feeling of fullness in the ear or feeling that the ear is plugged.  This condition may be diagnosed based on your symptoms, your medical history, and an ear exam.  This condition may be treated by using ear drops to soften the earwax or by having the earwax removed by a health care provider.  Do not put any objects, including cotton swabs, into your ear. You can clean the opening of your ear canal with a washcloth or facial tissue. This information is not intended to replace advice given to you by your health care provider. Make sure you discuss any questions you have with your health care provider. Document Released: 09/11/2004 Document Revised: 10/15/2016 Document Reviewed: 10/15/2016 Elsevier Interactive Patient Education  [employer].

## 2017-09-04 NOTE — Progress Notes (Signed)
Subjective:     Patient ID: Jerome Joseph, male   DOB: 05/29/1956, 62 y.o.   MRN: [id number]  HPI   Patient is a 62 -year-old male in no acute distress who presents to the clinic with right ear fullness.  Patient reports that he has a history of increased earwax bilaterally and uses Debrox over-the-counter intermittently. Reports that he has had unusual right ear fullness x 4 days. Denies any difficulty hearing but just feels like right ear is  muffled on one side. Denies any other symptoms or concerns at this time. Denies any recent antibiotic use or recent infections or illness.  Patient  denies any fever, chills, rash, chest pain, shortness of breath, nausea, vomiting, or diarrhea. Denies any ear discharge from either ear Blood pressure 124/80, pulse 92, temperature 98.3 F (36.8 C), resp. rate 16, height 5\' 9"  (1.753 m), weight 178 lb (80.7 kg), SpO2 99 %.  Review of Systems  Constitutional: Negative.   HENT: Positive for ear pain (fullness right ear ). Negative for congestion, dental problem, drooling, ear discharge, facial swelling, hearing loss (muffled), mouth sores, nosebleeds, postnasal drip, rhinorrhea, sinus pressure, sinus pain, sneezing, sore throat, tinnitus, trouble swallowing and voice change.   Respiratory: Negative.   Cardiovascular: Negative.   Gastrointestinal: Negative.   Musculoskeletal: Negative.   Skin: Negative.   Hematological: Negative.   Psychiatric/Behavioral: Negative.        Objective:   Physical Exam  Constitutional: He is oriented to person, place, and time. Vital signs are normal. He appears well-developed and well-nourished. He is active. No distress.  HENT:  Head: Normocephalic and atraumatic.  Right Ear: Hearing, external ear and ear canal normal. No lacerations. No drainage, swelling or tenderness. No foreign bodies. No mastoid tenderness. Tympanic membrane is erythematous. Tympanic membrane is not injected, not scarred, not perforated, not  retracted and not bulging. Tympanic membrane mobility is normal. A middle ear effusion is present. No hemotympanum. No decreased hearing is noted.  Nose: Nose normal. No mucosal edema or rhinorrhea. Right sinus exhibits no maxillary sinus tenderness and no frontal sinus tenderness. Left sinus exhibits no frontal sinus tenderness.  Mouth/Throat: Uvula is midline, oropharynx is clear and moist and mucous membranes are normal. No oropharyngeal exudate, posterior oropharyngeal edema, posterior oropharyngeal erythema or tonsillar abscesses.  Left ear with cerumen- soft and hard unable to visualize tympanic membrane or canal - no pain in this ear per patient.  Small area of cerumen left ear canal able to clearly visualize tympanic membrane which has moderate erythema.   Patient reports he can hear normally just feels " muffled" in right ear at times.   Eyes: Conjunctivae and EOM are normal. Pupils are equal, round, and reactive to light. Right eye exhibits no discharge. Left eye exhibits no discharge. No scleral icterus.  Neck: Normal range of motion. Neck supple.  Cardiovascular: Normal rate, regular rhythm, normal heart sounds and intact distal pulses. Exam reveals no gallop.  No murmur heard. Pulmonary/Chest: Effort normal and breath sounds normal. No respiratory distress. He has no wheezes. He has no rales. He exhibits no tenderness.  Musculoskeletal: Normal range of motion.  Neurological: He is alert and oriented to person, place, and time. He has normal reflexes.  Skin: Skin is warm and dry. No rash noted. He is not diaphoretic. No erythema. No pallor.  Psychiatric: He has a normal mood and affect. His behavior is normal. Judgment and thought content normal.  Vitals reviewed.      Assessment:  Allergies  Allergen Reactions  . Oxycodone-Acetaminophen Itching       Plan:     Meds ordered this encounter  Medications  . amoxicillin-clavulanate (AUGMENTIN) 875-125 MG tablet    Sig:  Take 1 tablet by mouth 2 (two) times daily.    Dispense:  20 tablet    Refill:  0   And is advised of his left ear cerumen impaction, but he should return to the office after completing antibiotics for irrigation if he prefers as doing this now could worsen infection if there is one behind cerumen as well.  Advised to return to clinic for an appointment if no improvement within 72 hours or if any symptoms change or worsen. Advised ER or urgent Care if after hours or on weekend. 911 for emergency symptoms at any time.      Patient verbalized understanding of all instructions given and denies any further questions at this time.

## 2017-09-09 ENCOUNTER — Ambulatory Visit: Payer: Self-pay | Admitting: Medical

## 2017-09-09 VITALS — BP 130/78 | HR 99 | Temp 96.5°F | Resp 126 | Wt 179.4 lb

## 2017-09-09 DIAGNOSIS — H6981 Other specified disorders of Eustachian tube, right ear: Secondary | ICD-10-CM

## 2017-09-09 DIAGNOSIS — H6691 Otitis media, unspecified, right ear: Secondary | ICD-10-CM

## 2017-09-09 MED ORDER — FLUTICASONE PROPIONATE 50 MCG/ACT NA SUSP: 2.0000 | Freq: Every day | NASAL | 1 refills | Status: DC

## 2017-09-09 NOTE — Patient Instructions (Signed)
Otitis Media, Adult Otitis media is redness, soreness, and puffiness (swelling) in the space just behind your eardrum (middle ear). It may be caused by allergies or infection. It often happens along with a cold. Follow these instructions at home:  Take your medicine as told. Finish it even if you start to feel better.  Only take over-the-counter or prescription medicines for pain, discomfort, or fever as told by your doctor.  Follow up with your doctor as told. Contact a doctor if:  You have otitis media only in one ear, or bleeding from your nose, or both.  You notice a lump on your neck.  You are not getting better in 3-5 days.  You feel worse instead of better. Get help right away if:  You have pain that is not helped with medicine.  You have puffiness, redness, or pain around your ear.  You get a stiff neck.  You cannot move part of your face (paralysis).  You notice that the bone behind your ear hurts when you touch it. This information is not intended to replace advice given to you by your health care provider. Make sure you discuss any questions you have with your health care provider. Document Released: 01/21/2008 Document Revised: 01/10/2016 Document Reviewed: 03/01/2013 Elsevier Interactive Patient Education  2017 [place]. Eustachian Tube Dysfunction The eustachian tube connects the middle ear to the back of the nose. It regulates air pressure in the middle ear by allowing air to move between the ear and nose. It also helps to drain fluid from the middle ear space. When the eustachian tube does not function properly, air pressure, fluid, or both can build up in the middle ear. Eustachian tube dysfunction can affect one or both ears. What are the causes? This condition happens when the eustachian tube becomes blocked or cannot open normally. This may result from:  Ear infections.  Colds and other upper respiratory infections.  Allergies.  Irritation, such as  from cigarette smoke or acid from the stomach coming up into the esophagus (gastroesophageal reflux).  Sudden changes in air pressure, such as from descending in an airplane.  Abnormal growths in the nose or throat, such as nasal polyps, tumors, or enlarged tissue at the back of the throat (adenoids).  What increases the risk? This condition may be more likely to develop in people who smoke and people who are overweight. Eustachian tube dysfunction may also be more likely to develop in children, especially children who have:  Certain birth defects of the mouth, such as cleft palate.  Large tonsils and adenoids.  What are the signs or symptoms? Symptoms of this condition may include:  A feeling of fullness in the ear.  Ear pain.  Clicking or popping noises in the ear.  Ringing in the ear.  Hearing loss.  Loss of balance.  Symptoms may get worse when the air pressure around you changes, such as when you travel to an area of high elevation or fly on an airplane. How is this diagnosed? This condition may be diagnosed based on:  Your symptoms.  A physical exam of your ear, nose, and throat.  Tests, such as those that measure: ? The movement of your eardrum (tympanogram). ? Your hearing (audiometry).  How is this treated? Treatment depends on the cause and severity of your condition. If your symptoms are mild, you may be able to relieve your symptoms by moving air into ("popping") your ears. If you have symptoms of fluid in your ears, treatment  may include:  Decongestants.  Antihistamines.  Nasal sprays or ear drops that contain medicines that reduce swelling (steroids).  In some cases, you may need to have a procedure to drain the fluid in your eardrum (myringotomy). In this procedure, a small tube is placed in the eardrum to:  Drain the fluid.  Restore the air in the middle ear space.  Follow these instructions at home:  Take over-the-counter and prescription  medicines only as told by your health care provider.  Use techniques to help pop your ears as recommended by your health care provider. These may include: ? Chewing gum. ? Yawning. ? Frequent, forceful swallowing. ? Closing your mouth, holding your nose closed, and gently blowing as if you are trying to blow air out of your nose.  Do not do any of the following until your health care provider approves: ? Travel to high altitudes. ? Fly in airplanes. ? Work in a Pension scheme manager or room. ? Scuba dive.  Keep your ears dry. Dry your ears completely after showering or bathing.  Do not smoke.  Keep all follow-up visits as told by your health care provider. This is important. Contact a health care provider if:  Your symptoms do not go away after treatment.  Your symptoms come back after treatment.  You are unable to pop your ears.  You have: ? A fever. ? Pain in your ear. ? Pain in your head or neck. ? Fluid draining from your ear.  Your hearing suddenly changes.  You become very dizzy.  You lose your balance. This information is not intended to replace advice given to you by your health care provider. Make sure you discuss any questions you have with your health care provider. Document Released: 08/31/2015 Document Revised: 01/10/2016 Document Reviewed: 08/23/2014 Elsevier Interactive Patient Education  [employer].

## 2017-09-09 NOTE — Progress Notes (Signed)
Meds ordered this encounter  . fluticasone (FLONASE) 50 MCG/ACT nasal spray    Sig: Place 2 sprays into both nostrils daily.    Dispense:  16 g    Refill:  1   Patient called and requests script to be sent for Flonase as above as patient left message with Set designer requesting as he had discussed this with colleague Liz Malady PA-C . Provider also discussed with colleague who is in agreement.Sent to [facility] [address] near [place].

## 2017-09-09 NOTE — Progress Notes (Signed)
   Subjective:    Patient ID: Jerome Joseph, male    DOB: 26-Oct-1955, 62 y.o.   MRN: [id number]  HPI  62 yo male in non acute distress comes in for what feels  Like right ear fullness, no pain. Complains of  Right ear hearing diminished. Ear infection diagnosed with otitis media on Friday. ( 5 days ago).  No cough , no malaise , no sinus pressure , runny nose  Is clear with some mild nasal congestion.    Review of Systems  Constitutional: Negative for chills and fever.  HENT: Positive for congestion and hearing loss (muffled sounding). Negative for ear discharge, ear pain, postnasal drip, sinus pressure, sinus pain, sneezing and sore throat.   Eyes: Negative for discharge and itching.  Respiratory: Negative for cough and shortness of breath.   Cardiovascular: Negative for chest pain.  Musculoskeletal: Negative for myalgias.  Skin: Negative for rash.  Hematological: Negative for adenopathy.       Objective:   Physical Exam  Constitutional: He is oriented to person, place, and time. He appears well-developed and well-nourished.  HENT:  Head: Normocephalic and atraumatic.  Right Ear: Tympanic membrane is not injected, not scarred, not perforated, not erythematous, not retracted and not bulging. A middle ear effusion is present. Decreased hearing is noted.  Left Ear: Hearing, external ear and ear canal normal.  Eyes: Conjunctivae and EOM are normal. Pupils are equal, round, and reactive to light.  Neck: Normal range of motion. Neck supple.  Cardiovascular: Normal rate, regular rhythm and normal heart sounds.  Pulmonary/Chest: Effort normal and breath sounds normal.  Lymphadenopathy:    He has no cervical adenopathy.  Neurological: He is alert and oriented to person, place, and time.  Skin: Skin is warm and dry.  Psychiatric: He has a normal mood and affect. His behavior is normal. Judgment and thought content normal.  Nursing note and vitals reviewed.   Cerumen half obscuring half  of the  the left TM The other half apears wnl.     Assessment & Plan:  Eustachian Tube dysfunction right   OTC Flonase take as directed.Finish antibiotics ( patient on Augmentin). Recommended prednisone if he would like to try to resolve fluid sooner, however he says this keeps him up at night so he declines at this time. Return to the clinic as needed.  If Continues  5 days after antibiotics come back into the clinic for a recheck.  He declines AVS, " I don't want to kill any trees". He verbalizes understanding and has no questions at discharge.

## 2017-09-09 NOTE — Addendum Note (Signed)
Addended by: Doreen Beam on: 09/09/2017 12:59 PM   Modules accepted: Orders

## 2017-09-25 ENCOUNTER — Encounter: Payer: Self-pay | Admitting: Adult Health

## 2017-09-25 ENCOUNTER — Ambulatory Visit: Payer: Self-pay | Admitting: Adult Health

## 2017-09-25 VITALS — BP 135/79 | HR 94 | Temp 98.6°F | Resp 16 | Ht 69.0 in | Wt 181.0 lb

## 2017-09-25 DIAGNOSIS — H6690 Otitis media, unspecified, unspecified ear: Secondary | ICD-10-CM

## 2017-09-25 DIAGNOSIS — H6981 Other specified disorders of Eustachian tube, right ear: Secondary | ICD-10-CM

## 2017-09-25 MED ORDER — CETIRIZINE HCL 10 MG PO TABS: 10.0000 mg | ORAL_TABLET | Freq: Every day | ORAL | 1 refills | Status: DC

## 2017-09-25 MED ORDER — LOSARTAN POTASSIUM-HCTZ 100-12.5 MG PO TABS: 1.0000 | ORAL_TABLET | Freq: Every day | ORAL | 0 refills | Status: DC

## 2017-09-25 MED ORDER — NEOMYCIN-POLYMYXIN-HC 3.5-10000-1 OT SOLN: 4.0000 [drp] | Freq: Four times a day (QID) | OTIC | 0 refills | Status: DC

## 2017-09-25 MED ORDER — PREDNISONE 10 MG (21) PO TBPK
ORAL_TABLET | ORAL | 0 refills | Status: DC
Start: 2017-09-25 — End: 2018-01-26

## 2017-09-25 NOTE — Progress Notes (Signed)
Subjective:     Patient ID: Jerome Joseph, male   DOB: May 26, 1956, 62 y.o.   MRN: [id number]  HPI  Patient is a 62 year old male in no acute distress who returns to the clinic for follow up of eustachian tube dysfunction  otitis media of right ear. He has completed Augmentin for ten days and is using Flonase daily. He reports his ears are some better but not " all the way better" . He reports his right ear is no better than 25 % from his first visit for his right ear.  He hears fluid in his ear.   He is using Debrox left ear only and wax is coming out now he reports denies any pain is this ear.   He declined Prednisone at last visit and he also reports hesitant to take due to messes with his sleep pattern.  Patient  denies any fever, chills, rash, chest pain, shortness of breath, nausea, vomiting, or diarrhea.  Denies cold or cough symptoms.    He sees Dr. Ouida Sills for his blood pressure and forgot to send in his Hyzaar medication in to his mail order pharmacy he says his medication will come in on 2/13. He reports  He saw Dr. Ouida Sills 6 months ago and only sees him yearly. He asks for a 7 day bridge.   Current Outpatient Medications:  .  amLODipine (NORVASC) 10 MG tablet, Take 10 mg by mouth., Disp: , Rfl:  .  aspirin EC 81 MG tablet, Take 81 mg by mouth., Disp: , Rfl:  .  Cholecalciferol (VITAMIN D3) 1000 units CAPS, Take by mouth., Disp: , Rfl:  .  clorazepate (TRANXENE) 7.5 MG tablet, Take 7.5 mg by mouth., Disp: , Rfl:  .  fluticasone (FLONASE) 50 MCG/ACT nasal spray, Place 2 sprays into both nostrils daily., Disp: 16 g, Rfl: 1 .  levothyroxine (SYNTHROID, LEVOTHROID) 88 MCG tablet, Take by mouth., Disp: , Rfl:  .  losartan-hydrochlorothiazide (HYZAAR) 100-12.5 MG tablet, , Disp: , Rfl: 2 .  olopatadine (PATANOL) 0.1 % ophthalmic solution, Place 1 drop into both eyes 2 (two) times daily., Disp: 5 mL, Rfl: 1 .  omeprazole (PRILOSEC) 40 MG capsule, , Disp: , Rfl: 2 .  ranitidine  (ZANTAC) 300 MG tablet, TAKE 1 T BY MOUTH TWICE DAILY, Disp: , Rfl: 0 .  sildenafil (REVATIO) 20 MG tablet, TAKE 2 TO 5 TABLETS BY MOUTH EVERY DAY AS NEEDED, Disp: , Rfl: 11    Vitals:   09/25/17 1430  BP: 135/79  Pulse: 94  Resp: 16  Temp: 98.6 F (37 C)  SpO2: 96%     Review of Systems  Constitutional: Negative.   HENT: Positive for ear pain. Negative for congestion, dental problem, drooling, ear discharge, facial swelling, hearing loss, mouth sores, nosebleeds, postnasal drip, rhinorrhea, sinus pressure, sinus pain, sneezing, sore throat, tinnitus, trouble swallowing and voice change.        Mild hearing decrease right ear / sounds muffled at time and has pressure and fluid.   Eyes: Negative.   Respiratory: Negative.   Cardiovascular: Negative.   Gastrointestinal: Negative.   Endocrine: Negative.   Genitourinary: Negative.   Musculoskeletal: Negative.   Skin: Negative.   Allergic/Immunologic:        -- Oxycodone-Acetaminophen -- Itching  Neurological: Negative.   Hematological: Negative.   Psychiatric/Behavioral: Negative.        Objective:   Physical Exam  Constitutional: He is oriented to person, place, and time. Vital signs are  normal. He appears well-developed and well-nourished. He is active.  Non-toxic appearance. He does not have a sickly appearance. He does not appear ill. No distress.  HENT:  Head: Normocephalic and atraumatic.  Right Ear: No lacerations. There is swelling (mild swelling in canal as well as moderate erythema in lower canal- denies trauma or Q-tips - he is not using Debrox in right ear. Denies pain with tragus pull ). No drainage or tenderness. No foreign bodies. No mastoid tenderness. Tympanic membrane is not injected, not scarred, not perforated, not erythematous, not retracted and not bulging. Tympanic membrane mobility is normal. A middle ear effusion is present. No hemotympanum. Decreased hearing (reports mild decrease/ understands whisper ) is  noted.  Left Ear: Hearing, tympanic membrane, external ear and ear canal normal.  Nose: Nose normal.  Mouth/Throat: Oropharynx is clear and moist. No oropharyngeal exudate.  Left ear with decreased wax since previous visit able to visualize a normal tympanic membrane.   Eyes: Conjunctivae and EOM are normal. Pupils are equal, round, and reactive to light. Right eye exhibits no discharge. Left eye exhibits no discharge. No scleral icterus.  Neck: Normal range of motion. Neck supple. No JVD present. No tracheal deviation present.  Cardiovascular: Normal rate, regular rhythm, normal heart sounds and intact distal pulses. Exam reveals no gallop and no friction rub.  No murmur heard. Pulmonary/Chest: Effort normal and breath sounds normal. No stridor. No respiratory distress. He has no wheezes. He has no rales. He exhibits no tenderness.  Abdominal: Soft. Bowel sounds are normal.  Musculoskeletal: Normal range of motion.  Lymphadenopathy:    He has no cervical adenopathy.  Neurological: He is alert and oriented to person, place, and time. He displays normal reflexes. No cranial nerve deficit. He exhibits normal muscle tone. Coordination normal.  Skin: Skin is warm and dry. No rash noted. He is not diaphoretic. No erythema. No pallor.  Psychiatric: He has a normal mood and affect. His behavior is normal. Judgment and thought content normal.  Nursing note and vitals reviewed.  Tympanic membrane no linger infected as was on 1st visit for right ear/ mild effusion and clear fluid visualized behind tympanic membrane/ no bulging.   No head or neck discomfort.      Assessment:    Eustachian tube dysfunction, right - Plan: Ambulatory referral to ENT  Otitis media, unspecified laterality, unspecified otitis media type       Plan:     He denied AVS to save trees.   Orders Placed This Encounter  Procedures  . Ambulatory referral to ENT      Referred to [facility] - he requests physician  above but will see anyone at the practice he reports.   Meds ordered this encounter  Medications  . neomycin-polymyxin-hydrocortisone (CORTISPORIN) OTIC solution    Sig: Place 4 drops into the right ear 4 (four) times daily.    Dispense:  10 mL    Refill:  0  . predniSONE (STERAPRED UNI-PAK 21 TAB) 10 MG (21) TBPK tablet    Sig: By mouth Take 6 tablets on day 1, Take 5 tablets day 2 Take 4 tablets day 3 Take 3 tablets day 4 Take 2 tablets day five 5 Take 1 tablet day    Dispense:  21 tablet    Refill:  0  . cetirizine (ZYRTEC) 10 MG tablet    Sig: Take 1 tablet (10 mg total) by mouth daily.    Dispense:  30 tablet    Refill:  1  . losartan-hydrochlorothiazide (HYZAAR) 100-12.5 MG tablet    Sig: Take 1 tablet by mouth daily.    Dispense:  10 tablet    Refill:  0    He agrees to try Antihistamine and Prednisone. He will start Zyrtec daily as above and is in agreement.  Gave 10 tablets / days of HYZAAR at patient request as his mail order is running late and he reports seeing Dr. Ouida Sills for blood pleasure and well exams regularly and had mail order script from Kirk Ruths, MD. If he needs further refills he will call his Kirk Ruths, MD Patient denied any kidney or liver dysfunction and reported normal labs 6 months ago with Dr. Ouida Sills.   Advised to return to clinic for an appointment if no improvement within 72 hours or if any symptoms change or worsen. Advised ER or urgent Care if after hours or on weekend. 911 for emergency symptoms at any time.   Patient verbalized understanding of all instructions given and denies any further questions at this time.

## 2017-09-25 NOTE — Patient Instructions (Addendum)
Hypertension Hypertension is another name for high blood pressure. High blood pressure forces your heart to work harder to pump blood. This can cause problems over time. There are two numbers in a blood pressure reading. There is a top number (systolic) over a bottom number (diastolic). It is best to have a blood pressure below 120/80. Healthy choices can help lower your blood pressure. You may need medicine to help lower your blood pressure if:  Your blood pressure cannot be lowered with healthy choices.  Your blood pressure is higher than 130/80.  Follow these instructions at home: Eating and drinking  If directed, follow the DASH eating plan. This diet includes: ? Filling half of your plate at each meal with fruits and vegetables. ? Filling one quarter of your plate at each meal with whole grains. Whole grains include whole wheat pasta, brown rice, and whole grain bread. ? Eating or drinking low-fat dairy products, such as skim milk or low-fat yogurt. ? Filling one quarter of your plate at each meal with low-fat (lean) proteins. Low-fat proteins include fish, skinless chicken, eggs, beans, and tofu. ? Avoiding fatty meat, cured and processed meat, or chicken with skin. ? Avoiding premade or processed food.  Eat less than 1,500 mg of salt (sodium) a day.  Limit alcohol use to no more than 1 drink a day for nonpregnant women and 2 drinks a day for men. One drink equals 12 oz of beer, 5 oz of wine, or 1 oz of hard liquor. Lifestyle  Work with your doctor to stay at a healthy weight or to lose weight. Ask your doctor what the best weight is for you.  Get at least 30 minutes of exercise that causes your heart to beat faster (aerobic exercise) most days of the week. This may include walking, swimming, or biking.  Get at least 30 minutes of exercise that strengthens your muscles (resistance exercise) at least 3 days a week. This may include lifting weights or pilates.  Do not use any  products that contain nicotine or tobacco. This includes cigarettes and e-cigarettes. If you need help quitting, ask your doctor.  Check your blood pressure at home as told by your doctor.  Keep all follow-up visits as told by your doctor. This is important. Medicines  Take over-the-counter and prescription medicines only as told by your doctor. Follow directions carefully.  Do not skip doses of blood pressure medicine. The medicine does not work as well if you skip doses. Skipping doses also puts you at risk for problems.  Ask your doctor about side effects or reactions to medicines that you should watch for. Contact a doctor if:  You think you are having a reaction to the medicine you are taking.  You have headaches that keep coming back (recurring).  You feel dizzy.  You have swelling in your ankles.  You have trouble with your vision. Get help right away if:  You get a very bad headache.  You start to feel confused.  You feel weak or numb.  You feel faint.  You get very bad pain in your: ? Chest. ? Belly (abdomen).  You throw up (vomit) more than once.  You have trouble breathing. Summary  Hypertension is another name for high blood pressure.  Making healthy choices can help lower blood pressure. If your blood pressure cannot be controlled with healthy choices, you may need to take medicine. This information is not intended to replace advice given to you by your health care   provider. Make sure you discuss any questions you have with your health care provider. Document Released: 01/21/2008 Document Revised: 07/02/2016 Document Reviewed: 07/02/2016 Elsevier Interactive Patient Education  2018 [employer]. Eustachian Tube Dysfunction The eustachian tube connects the middle ear to the back of the nose. It regulates air pressure in the middle ear by allowing air to move between the ear and nose. It also helps to drain fluid from the middle ear space. When the  eustachian tube does not function properly, air pressure, fluid, or both can build up in the middle ear. Eustachian tube dysfunction can affect one or both ears. What are the causes? This condition happens when the eustachian tube becomes blocked or cannot open normally. This may result from:  Ear infections.  Colds and other upper respiratory infections.  Allergies.  Irritation, such as from cigarette smoke or acid from the stomach coming up into the esophagus (gastroesophageal reflux).  Sudden changes in air pressure, such as from descending in an airplane.  Abnormal growths in the nose or throat, such as nasal polyps, tumors, or enlarged tissue at the back of the throat (adenoids).  What increases the risk? This condition may be more likely to develop in people who smoke and people who are overweight. Eustachian tube dysfunction may also be more likely to develop in children, especially children who have:  Certain birth defects of the mouth, such as cleft palate.  Large tonsils and adenoids.  What are the signs or symptoms? Symptoms of this condition may include:  A feeling of fullness in the ear.  Ear pain.  Clicking or popping noises in the ear.  Ringing in the ear.  Hearing loss.  Loss of balance.  Symptoms may get worse when the air pressure around you changes, such as when you travel to an area of high elevation or fly on an airplane. How is this diagnosed? This condition may be diagnosed based on:  Your symptoms.  A physical exam of your ear, nose, and throat.  Tests, such as those that measure: ? The movement of your eardrum (tympanogram). ? Your hearing (audiometry).  How is this treated? Treatment depends on the cause and severity of your condition. If your symptoms are mild, you may be able to relieve your symptoms by moving air into ("popping") your ears. If you have symptoms of fluid in your ears, treatment may  include:  Decongestants.  Antihistamines.  Nasal sprays or ear drops that contain medicines that reduce swelling (steroids).  In some cases, you may need to have a procedure to drain the fluid in your eardrum (myringotomy). In this procedure, a small tube is placed in the eardrum to:  Drain the fluid.  Restore the air in the middle ear space.  Follow these instructions at home:  Take over-the-counter and prescription medicines only as told by your health care provider.  Use techniques to help pop your ears as recommended by your health care provider. These may include: ? Chewing gum. ? Yawning. ? Frequent, forceful swallowing. ? Closing your mouth, holding your nose closed, and gently blowing as if you are trying to blow air out of your nose.  Do not do any of the following until your health care provider approves: ? Travel to high altitudes. ? Fly in airplanes. ? Work in a Pension scheme manager or room. ? Scuba dive.  Keep your ears dry. Dry your ears completely after showering or bathing.  Do not smoke.  Keep all follow-up visits as told by  your health care provider. This is important. Contact a health care provider if:  Your symptoms do not go away after treatment.  Your symptoms come back after treatment.  You are unable to pop your ears.  You have: ? A fever. ? Pain in your ear. ? Pain in your head or neck. ? Fluid draining from your ear.  Your hearing suddenly changes.  You become very dizzy.  You lose your balance. This information is not intended to replace advice given to you by your health care provider. Make sure you discuss any questions you have with your health care provider. Document Released: 08/31/2015 Document Revised: 01/10/2016 Document Reviewed: 08/23/2014 Elsevier Interactive Patient Education  2018 [employer]. Otitis Media, Adult Otitis media is redness, soreness, and puffiness (swelling) in the space just behind your eardrum (middle  ear). It may be caused by allergies or infection. It often happens along with a cold. Follow these instructions at home:  Take your medicine as told. Finish it even if you start to feel better.  Only take over-the-counter or prescription medicines for pain, discomfort, or fever as told by your doctor.  Follow up with your doctor as told. Contact a doctor if:  You have otitis media only in one ear, or bleeding from your nose, or both.  You notice a lump on your neck.  You are not getting better in 3-5 days.  You feel worse instead of better. Get help right away if:  You have pain that is not helped with medicine.  You have puffiness, redness, or pain around your ear.  You get a stiff neck.  You cannot move part of your face (paralysis).  You notice that the bone behind your ear hurts when you touch it. This information is not intended to replace advice given to you by your health care provider. Make sure you discuss any questions you have with your health care provider. Document Released: 01/21/2008 Document Revised: 01/10/2016 Document Reviewed: 03/01/2013 Elsevier Interactive Patient Education  2017 [employer].

## 2017-09-30 ENCOUNTER — Telehealth: Payer: Self-pay

## 2017-09-30 NOTE — Telephone Encounter (Signed)
Pt called to inquire about his ENT referral; states he has not heard from [facility] yet; we did receive a fax from [facility]  ENT and appt is scheduled for 10/14/17 at 2:15 pm in the [facility] office; pt verb u/o

## 2018-01-26 ENCOUNTER — Encounter: Payer: Self-pay | Admitting: Medical

## 2018-01-26 ENCOUNTER — Ambulatory Visit: Payer: Self-pay | Admitting: Medical

## 2018-01-26 VITALS — BP 128/79 | HR 81 | Temp 97.8°F | Resp 16 | Wt 181.2 lb

## 2018-01-26 DIAGNOSIS — J069 Acute upper respiratory infection, unspecified: Secondary | ICD-10-CM

## 2018-01-26 DIAGNOSIS — H6983 Other specified disorders of Eustachian tube, bilateral: Secondary | ICD-10-CM

## 2018-01-26 DIAGNOSIS — R05 Cough: Secondary | ICD-10-CM

## 2018-01-26 DIAGNOSIS — R059 Cough, unspecified: Secondary | ICD-10-CM

## 2018-01-26 MED ORDER — AMOXICILLIN-POT CLAVULANATE 875-125 MG PO TABS: 1.0000 | ORAL_TABLET | Freq: Two times a day (BID) | ORAL | 0 refills | Status: DC

## 2018-01-26 MED ORDER — BENZONATATE 100 MG PO CAPS: ORAL_CAPSULE | ORAL | 0 refills | Status: DC

## 2018-01-26 NOTE — Progress Notes (Signed)
Subjective:    Patient ID: Jerome Joseph, male    DOB: [birth year], 62 y.o.   MRN: [id number]  HPI 62 yo male in non acute distress. Cough for one week occasionally productive  clear, worse over the weekend  Now with  green mucus started Sunday and now with sinus pressure.  Stopped OTC Zyrtec and  Flonase. Restarted Flonase 3- 4 days ago. sometimes has a runny nose. Green discharge with sneezing some of the time.Did receive the  Flu vaccine.  Yesterday felt worse, worked 1/2 day. Feeling better today then yesterday. Back at work.   Dog is sick "from Old age".  70 yo, Patient states he did not sleep well due to dog. " he is sleeping during the day and up at night.  Seen by Dr. Carlis Abbott said it would take a "while for the fluid to clear up behind ear drums.  "Dr Carlis Abbott wanted a colleague to look it . Ear drum is abnormal".  " some fullness in the right ear.No pain."  No history of Bronchitis or Pneumonia   Review of Systems  Constitutional: Positive for fatigue. Negative for chills and fever.  HENT: Positive for congestion and sneezing. Negative for ear pain, nosebleeds, postnasal drip, sinus pressure, sinus pain and sore throat.   Eyes: Negative for discharge and itching.  Respiratory: Positive for cough. Negative for shortness of breath and wheezing.   Cardiovascular: Negative for chest pain, palpitations and leg swelling.  Gastrointestinal: Negative for abdominal pain.  Genitourinary: Negative for dysuria.  Musculoskeletal: Positive for myalgias (yesterday yes and today no).  Skin: Negative for rash.  Allergic/Immunologic: Positive for environmental allergies. Negative for food allergies.  Neurological: Positive for headaches (yesterday). Negative for dizziness, syncope and speech difficulty.  Hematological: Negative for adenopathy.  Psychiatric/Behavioral: Negative for behavioral problems, self-injury and suicidal ideas.       Objective:   Physical Exam  Constitutional: He is  oriented to person, place, and time. He appears well-developed and well-nourished.  HENT:  Head: Normocephalic and atraumatic.  Right Ear: External ear normal.  Left Ear: External ear normal.  Mouth/Throat: Oropharynx is clear and moist.  Eyes: Pupils are equal, round, and reactive to light. Conjunctivae and EOM are normal.  Neck: Normal range of motion. Neck supple.  Cardiovascular: Normal rate, regular rhythm and normal heart sounds.  Pulmonary/Chest: Effort normal and breath sounds normal. No stridor. No respiratory distress. He has no wheezes.  Lymphadenopathy:    He has no cervical adenopathy.  Neurological: He is alert and oriented to person, place, and time.  Skin: Skin is warm and dry.  Psychiatric: He has a normal mood and affect. His behavior is normal. Judgment and thought content normal.  Nursing note and vitals reviewed.         Assessment & Plan:  Upper Respiratory infection , cough Eustachian tube dysfunction , though better than the last time I saw patient. Meds ordered this encounter  Medications  . amoxicillin-clavulanate (AUGMENTIN) 875-125 MG tablet    Sig: Take 1 tablet by mouth 2 (two) times daily. Take with food    Dispense:  20 tablet    Refill:  0  . benzonatate (TESSALON PERLES) 100 MG capsule    Sig: 1-2 capsules every 8 hours as needed for cough    Dispense:  30 capsule    Refill:  0  Rest, increase fluids, stop the Mucinex DM if using the Benzonatate Perles ( capsules). To return to clinic in  3-5 days  if not imrproving. Patient verbalizes understanding and has no questions at discharge.

## 2018-01-26 NOTE — Patient Instructions (Signed)
Upper Respiratory Infection, Adult Most upper respiratory infections (URIs) are caused by a virus. A URI affects the nose, throat, and upper air passages. The most common type of URI is often called "the common cold." Follow these instructions at home:  Take medicines only as told by your doctor.  Gargle warm saltwater or take cough drops to comfort your throat as told by your doctor.  Use a warm mist humidifier or inhale steam from a shower to increase air moisture. This may make it easier to breathe.  Drink enough fluid to keep your pee (urine) clear or pale yellow.  Eat soups and other clear broths.  Have a healthy diet.  Rest as needed.  Go back to work when your fever is gone or your doctor says it is okay. ? You may need to stay home longer to avoid giving your URI to others. ? You can also wear a face mask and wash your hands often to prevent spread of the virus.  Use your inhaler more if you have asthma.  Do not use any tobacco products, including cigarettes, chewing tobacco, or electronic cigarettes. If you need help quitting, ask your doctor. Contact a doctor if:  You are getting worse, not better.  Your symptoms are not helped by medicine.  You have chills.  You are getting more short of breath.  You have brown or red mucus.  You have yellow or brown discharge from your nose.  You have pain in your face, especially when you bend forward.  You have a fever.  You have puffy (swollen) neck glands.  You have pain while swallowing.  You have white areas in the back of your throat. Get help right away if:  You have very bad or constant: ? Headache. ? Ear pain. ? Pain in your forehead, behind your eyes, and over your cheekbones (sinus pain). ? Chest pain.  You have long-lasting (chronic) lung disease and any of the following: ? Wheezing. ? Long-lasting cough. ? Coughing up blood. ? A change in your usual mucus.  You have a stiff neck.  You have  changes in your: ? Vision. ? Hearing. ? Thinking. ? Mood. This information is not intended to replace advice given to you by your health care provider. Make sure you discuss any questions you have with your health care provider. Document Released: 01/21/2008 Document Revised: 04/06/2016 Document Reviewed: 11/09/2013 Elsevier Interactive Patient Education  2018 Elsevier Inc. Cough, Adult A cough helps to clear your throat and lungs. A cough may last only 2-3 weeks (acute), or it may last longer than 8 weeks (chronic). Many different things can cause a cough. A cough may be a sign of an illness or another medical condition. Follow these instructions at home:  Pay attention to any changes in your cough.  Take medicines only as told by your doctor. ? If you were prescribed an antibiotic medicine, take it as told by your doctor. Do not stop taking it even if you start to feel better. ? Talk with your doctor before you try using a cough medicine.  Drink enough fluid to keep your pee (urine) clear or pale yellow.  If the air is dry, use a cold steam vaporizer or humidifier in your home.  Stay away from things that make you cough at work or at home.  If your cough is worse at night, try using extra pillows to raise your head up higher while you sleep.  Do not smoke, and try not   to be around smoke. If you need help quitting, ask your doctor.  Do not have caffeine.  Do not drink alcohol.  Rest as needed. Contact a doctor if:  You have new problems (symptoms).  You cough up yellow fluid (pus).  Your cough does not get better after 2-3 weeks, or your cough gets worse.  Medicine does not help your cough and you are not sleeping well.  You have pain that gets worse or pain that is not helped with medicine.  You have a fever.  You are losing weight and you do not know why.  You have night sweats. Get help right away if:  You cough up blood.  You have trouble breathing.  Your  heartbeat is very fast. This information is not intended to replace advice given to you by your health care provider. Make sure you discuss any questions you have with your health care provider. Document Released: 04/17/2011 Document Revised: 01/10/2016 Document Reviewed: 10/11/2014 Elsevier Interactive Patient Education  2018 Elsevier Inc.  

## 2018-01-29 ENCOUNTER — Telehealth: Payer: Self-pay | Admitting: Medical

## 2018-01-29 NOTE — Telephone Encounter (Signed)
Called patient to see how he was doing. Got answering machine and left message for him to return my call.

## 2018-02-01 ENCOUNTER — Telehealth: Payer: Self-pay | Admitting: Medical

## 2018-02-01 NOTE — Telephone Encounter (Signed)
Called patient on Friday  01/29/18, he is feeling much better and is thankful  For his care.  Return to the clinic as needed.

## 2018-02-26 ENCOUNTER — Other Ambulatory Visit: Payer: Self-pay | Admitting: Medical

## 2018-02-26 DIAGNOSIS — H1013 Acute atopic conjunctivitis, bilateral: Secondary | ICD-10-CM

## 2018-03-02 ENCOUNTER — Other Ambulatory Visit: Payer: Self-pay

## 2018-03-02 DIAGNOSIS — I10 Essential (primary) hypertension: Secondary | ICD-10-CM

## 2018-03-02 DIAGNOSIS — E7800 Pure hypercholesterolemia, unspecified: Secondary | ICD-10-CM

## 2018-03-02 DIAGNOSIS — E039 Hypothyroidism, unspecified: Secondary | ICD-10-CM

## 2018-03-02 DIAGNOSIS — Z0000 Encounter for general adult medical examination without abnormal findings: Secondary | ICD-10-CM

## 2018-03-03 LAB — LIPID PANEL
CHOLESTEROL TOTAL: 278 mg/dL — AB (ref 100–199)
Chol/HDL Ratio: 4.9 ratio (ref 0.0–5.0)
HDL: 57 mg/dL (ref >39)
LDL Calculated: 195 mg/dL — ABNORMAL HIGH (ref 0–99)
TRIGLYCERIDES: 132 mg/dL (ref 0–149)
VLDL Cholesterol Cal: 26 mg/dL (ref 5–40)

## 2018-03-03 LAB — HEPATIC FUNCTION PANEL
ALBUMIN: 4.5 g/dL (ref 3.6–4.8)
ALK PHOS: 62 IU/L (ref 39–117)
ALT: 20 IU/L (ref 0–44)
AST: 21 IU/L (ref 0–40)
BILIRUBIN TOTAL: 1 mg/dL (ref 0.0–1.2)
Bilirubin, Direct: 0.21 mg/dL (ref 0.00–0.40)
Total Protein: 6.6 g/dL (ref 6.0–8.5)

## 2018-03-03 LAB — BASIC METABOLIC PANEL
BUN / CREAT RATIO: 13 (ref 10–24)
BUN: 11 mg/dL (ref 8–27)
CO2: 21 mmol/L (ref 20–29)
Calcium: 9.2 mg/dL (ref 8.6–10.2)
Chloride: 101 mmol/L (ref 96–106)
Creatinine, Ser: 0.84 mg/dL (ref 0.76–1.27)
GFR, EST AFRICAN AMERICAN: 108 mL/min/{1.73_m2} (ref >59)
GFR, EST NON AFRICAN AMERICAN: 94 mL/min/{1.73_m2} (ref >59)
Glucose: 85 mg/dL (ref 65–99)
POTASSIUM: 4.2 mmol/L (ref 3.5–5.2)
SODIUM: 141 mmol/L (ref 134–144)

## 2018-03-03 LAB — TSH: TSH: 6.04 u[IU]/mL — ABNORMAL HIGH (ref 0.450–4.500)

## 2018-05-13 ENCOUNTER — Ambulatory Visit: Payer: Self-pay

## 2018-10-05 ENCOUNTER — Encounter: Payer: Self-pay | Admitting: Nurse Practitioner

## 2018-10-05 ENCOUNTER — Ambulatory Visit: Payer: Self-pay | Admitting: Nurse Practitioner

## 2018-10-05 VITALS — BP 142/96 | HR 100 | Temp 97.7°F | Resp 18 | Wt 182.4 lb

## 2018-10-05 DIAGNOSIS — R6889 Other general symptoms and signs: Secondary | ICD-10-CM

## 2018-10-05 DIAGNOSIS — R059 Cough, unspecified: Secondary | ICD-10-CM

## 2018-10-05 DIAGNOSIS — J00 Acute nasopharyngitis [common cold]: Secondary | ICD-10-CM

## 2018-10-05 DIAGNOSIS — R05 Cough: Secondary | ICD-10-CM

## 2018-10-05 LAB — POCT INFLUENZA A/B
INFLUENZA A, POC: NEGATIVE
INFLUENZA B, POC: NEGATIVE

## 2018-10-05 MED ORDER — BENZONATATE 100 MG PO CAPS: ORAL_CAPSULE | ORAL | 0 refills | Status: DC

## 2018-10-05 NOTE — Progress Notes (Signed)
   Subjective:    Patient ID: Jerome Joseph, male    DOB: [birth year], 63 y.o.   MRN: [id number]  HPI Jerome Joseph comes to the health and wellness clinic today with c/o nonproductive cough and fever 100.1 x 2 days. He reports had a sore throat that resolved yesterday, then at onset nausea with no vomiting that resolved. He's had fatigue and has been resting. Denies headache but reports bodyache. Has been taking sudafed routinely and took Tylenol about 4 hours ago. He does admit whatever he has, he has since given to his wife. Has received the influenza vaccine for this season.    Review of Systems  Constitutional: Positive for fatigue and fever.  HENT: Positive for sore throat.   Respiratory: Positive for cough. Negative for shortness of breath and wheezing.   Gastrointestinal: Positive for nausea.  Musculoskeletal: Positive for myalgias.       Objective:   Physical Exam Vitals signs reviewed.  Constitutional:      Appearance: Normal appearance. He is well-developed.  HENT:     Head: Normocephalic and atraumatic.     Comments: No maxillary or frontal sinus tenderness    Right Ear: Ear canal normal.     Left Ear: Ear canal normal.     Ears:     Comments: TM with clear serous fluid bilaterally    Nose: Nose normal.     Mouth/Throat:     Mouth: Mucous membranes are moist.     Comments: Mildly injected pharynx with cobblestoning Neck:     Musculoskeletal: Normal range of motion and neck supple. No muscular tenderness.  Cardiovascular:     Rate and Rhythm: Normal rate and regular rhythm.     Heart sounds: Normal heart sounds.  Pulmonary:     Effort: Pulmonary effort is normal. No respiratory distress.     Comments: Course lung sounds throughout Abdominal:     General: Bowel sounds are normal.     Palpations: Abdomen is soft.  Musculoskeletal: Normal range of motion.  Lymphadenopathy:     Cervical: Cervical adenopathy present.  Skin:    General: Skin is warm and dry.   Neurological:     Mental Status: He is alert and oriented to person, place, and time.  Psychiatric:        Mood and Affect: Mood normal.    Orders Placed This Encounter  Procedures  . POCT Influenza A/B        Assessment & Plan:

## 2018-10-05 NOTE — Patient Instructions (Addendum)
Common cold  Flu-like symptoms - Plan: POCT Influenza A/B  Cough - Plan: benzonatate (TESSALON PERLES) 100 MG capsule   Fluids, rest and good hand hygiene Your flu test was negative today Consider changing Sudafed to Coricidin HBP for multi-symptom relief and it doesn't affect your blood pressure Continue the Tylenol as directed Return to the clinic if symptoms persist or worsen after 3 days Encouraged patient to call or return to the office for an appointment if no improvement in symptoms or if symptoms change or worsen after 72 hours of planned treatment. Patient verbalized understanding of all instructions and has no further questions or concerns at this time.

## 2019-02-18 ENCOUNTER — Other Ambulatory Visit: Payer: Self-pay

## 2019-02-18 DIAGNOSIS — Z20822 Contact with and (suspected) exposure to covid-19: Secondary | ICD-10-CM

## 2019-02-22 ENCOUNTER — Other Ambulatory Visit: Payer: Self-pay

## 2019-02-22 DIAGNOSIS — Z20822 Contact with and (suspected) exposure to covid-19: Secondary | ICD-10-CM

## 2019-02-26 LAB — NOVEL CORONAVIRUS, NAA: SARS-CoV-2, NAA: NOT DETECTED

## 2019-05-11 ENCOUNTER — Other Ambulatory Visit: Payer: Self-pay

## 2019-05-11 ENCOUNTER — Ambulatory Visit: Payer: BC Managed Care – PPO

## 2019-05-11 DIAGNOSIS — Z23 Encounter for immunization: Secondary | ICD-10-CM

## 2019-05-12 ENCOUNTER — Other Ambulatory Visit: Payer: Self-pay | Admitting: Internal Medicine

## 2019-05-12 DIAGNOSIS — Z20822 Contact with and (suspected) exposure to covid-19: Secondary | ICD-10-CM

## 2019-05-13 LAB — NOVEL CORONAVIRUS, NAA: SARS-CoV-2, NAA: NOT DETECTED

## 2019-06-07 ENCOUNTER — Other Ambulatory Visit
Admission: RE | Admit: 2019-06-07 | Discharge: 2019-06-07 | Disposition: A | Discharge status: Home / Self Care | Payer: BC Managed Care – PPO | Attending: Urology | Admitting: Urology

## 2019-06-07 ENCOUNTER — Encounter: Payer: Self-pay | Admitting: Urology

## 2019-06-07 ENCOUNTER — Ambulatory Visit: Payer: BC Managed Care – PPO | Admitting: Urology

## 2019-06-07 ENCOUNTER — Other Ambulatory Visit: Payer: Self-pay

## 2019-06-07 VITALS — BP 150/88 | HR 105 | Ht 70.0 in | Wt 188.0 lb

## 2019-06-07 DIAGNOSIS — R972 Elevated prostate specific antigen [PSA]: Secondary | ICD-10-CM

## 2019-06-07 NOTE — Patient Instructions (Signed)
Prostate Cancer Screening  The prostate is a walnut-sized gland that is located below the bladder and in front of the rectum in males. The function of the prostate (prostate gland) is to add fluid to semen during ejaculation. Prostate cancer is the second most common type of cancer in men. A screening test for cancer is a test that is done before cancer symptoms start. Screening can help to identify cancer at an early stage, when the cancer can be treated more easily. The recommended prostate cancer screening test is a blood test called the prostate-specific antigen (PSA) test. PSA is a protein that is made in the prostate. As you age, your prostate naturally produces more PSA. Abnormally high PSA levels may be caused by:  Prostate cancer.  An enlarged prostate that is not caused by cancer (benign prostatic hyperplasia, BPH). This condition is very common in older men.  A prostate gland infection (prostatitis).  Medicines to assist with hair growth, such as finasteride. Depending on the PSA results, you may need more tests, such as:  A physical exam to check the size of your prostate gland.  Blood and imaging tests.  A procedure to remove tissue samples from your prostate gland for testing (biopsy). Who should have screening? Screening recommendations vary based on age.  If you are younger than age 40, screening is not recommended.  If you are age 40-54 and you have no risk factors, screening is not recommended.  If you are younger than age 55, ask your health care provider if you need screening if you have one of these risk factors: ? Being of African-American descent. ? Having a family history of prostate cancer.  If you are age 55-69, talk with your health care provider about your need for screening and how often screening should be done.  If you are older than age 70, screening is not recommended. This is because the risks that screening can cause are greater than the benefits  that it may provide (risks outweigh the benefits). If you are at high risk for prostate cancer, your health care provider may recommend that you have screenings more often or start screening at a younger age. You may be at high risk if you:  Are older than age 55.  Are African-American.  Have a father, brother, or uncle who has been diagnosed with prostate cancer. The risk may be higher if your family member's cancer occurred at an early age. What are the benefits of screening? There is a small chance that screening may lower your risk of dying from prostate cancer. The chance is small because prostate cancer is typically a slow-growing cancer, and most men with prostate cancer die from a different cause. What are the risks of screening? The main risk of prostate cancer screening is diagnosing and treating prostate cancer that would never have caused any symptoms or problems (overdiagnosis and overtreatment). PSA screening cannot tell you if your PSA is high due to cancer or a different cause. A prostate biopsy is the only procedure to diagnose prostate cancer. Even the results of a biopsy may not tell you if your cancer needs to be treated. Slow-growing prostate cancer may not need any treatment other than monitoring, so diagnosing and treating it may cause unnecessary stress or other side effects. A prostate biopsy may also cause:  Infection or fever.  A false negative. This is a result that shows that you do not have prostate cancer when you actually do have prostate cancer. Questions   to ask your health care provider  When should I start prostate cancer screening?  What is my risk for prostate cancer?  How often do I need screening?  What type of screening tests do I need?  How do I get my test results?  What do my results mean?  Do I need treatment? Contact a health care provider if:  You have difficulty urinating.  You have pain when you urinate or ejaculate.  You have  blood in your urine or semen.  You have pain in your back or in the area of your prostate.  You have trouble getting or maintaining an erection (erectile dysfunction, ED). Summary  Prostate cancer is a common type of cancer in men. The prostate (prostate gland) is located below the bladder and in front of the rectum. This gland adds fluid to semen during ejaculation.  Prostate cancer screening may identify cancer at an early stage, when the cancer can be treated more easily.  The prostate-specific antigen (PSA) test is the recommended screening test for prostate cancer.  Discuss the risks and benefits of prostate cancer screening with your health care provider. If you are age 70 or older, screening is likely to lead to more risks than benefits (risks outweigh the benefits). This information is not intended to replace advice given to you by your health care provider. Make sure you discuss any questions you have with your health care provider. Document Released: 05/15/2017 Document Revised: 07/17/2017 Document Reviewed: 05/15/2017 Elsevier Patient Education  2020 Elsevier Inc.   Transrectal Ultrasound-Guided Prostate Biopsy A transrectal ultrasound-guided prostate biopsy is a procedure to remove samples of prostate tissue for testing. The procedure uses ultrasound images to guide the process of removing the samples. The samples are taken to a lab to be checked for prostate cancer. This procedure is usually done to evaluate the prostate gland of men who have raised (elevated) levels of prostate-specific antigen (PSA), which can be a sign of prostate cancer. Tell a health care provider about:  Any allergies you have.  All medicines you are taking, including vitamins, herbs, eye drops, creams, and over-the-counter medicines.  Any problems you or family members have had with anesthetic medicines.  Any blood disorders you have.  Any surgeries you have had.  Any medical conditions you  have. What are the risks? Generally, this is a safe procedure. However, problems may occur, including:  Prostate infection.  Bleeding from the rectum.  Blood in the urine.  Allergic reactions to medicines.  Damage to surrounding structures such as blood vessels, organs, or muscles.  Difficulty passing urine.  Nerve damage. This is usually temporary.  Pain. What happens before the procedure? Staying hydrated Follow instructions from your health care provider about hydration, which may include:  Up to 2 hours before the procedure - you may continue to drink clear liquids, such as water, clear fruit juice, black coffee, and plain tea.  Eating and drinking restrictions Follow instructions from your health care provider about eating and drinking, which may include:  8 hours before the procedure - stop eating heavy meals or foods such as meat, fried foods, or fatty foods.  6 hours before the procedure - stop eating light meals or foods, such as toast or cereal.  6 hours before the procedure - stop drinking milk or drinks that contain milk.  2 hours before the procedure - stop drinking clear liquids. Medicines Ask your health care provider about:  Changing or stopping your regular medicines. This is   especially important if you are taking diabetes medicines or blood thinners.  Taking over-the-counter medicines, vitamins, herbs, and supplements.  Taking medicines such as aspirin and ibuprofen. These medicines can thin your blood. Do not take these medicines unless your health care provider tells you to take them. General instructions  You may be given antibiotic medicine to help prevent infection. If so, take the antibiotic as told by your health care provider.  You will be given an enema. During an enema, a liquid is injected into your rectum to clear out waste.  You may have a blood or urine sample taken.  Plan to have someone take you home from the hospital or clinic.  What happens during the procedure?   To lower your risk of infection: ? Your health care team will wash or sanitize their hands. ? Hair may be removed from the surgical area. ? Your skin will be cleaned with a germ-killing soap. ? You may be given antibiotic medicine.  An IV will be inserted into one of your veins.  You will be given one or both of the following: ? A medicine to help you relax (sedative). ? A medicine to numb the area (local anesthetic).  You will be placed on your left side, and your knees will be bent toward your chest.  A probe with lubricated gel will be placed into your rectum, and images will be taken of your prostate and surrounding structures.  Numbing medicine will be injected into your prostate.  A biopsy needle will be inserted through your rectum and guided to your prostate using the ultrasound images.  Prostate tissue samples will be removed, and the needle will then be removed.  The biopsy samples will be sent to a lab to be tested. The procedure may vary among health care providers and hospitals. What happens after the procedure?  Your blood pressure, heart rate, breathing rate, and blood oxygen level will be monitored until the medicines you were given have worn off.  You may have some discomfort in the rectal area. You will be given pain medicine as needed.  Do not drive for 24 hours if you received a sedative. Summary  A transrectal ultrasound-guided biopsy removes samples of tissue from your prostate.  This procedure is usually done to evaluate the prostate gland of men who have raised (elevated) levels of prostate-specific antigen (PSA), which can be a sign of prostate cancer.  After your procedure, you may feel some discomfort in the rectal area.  Plan to have someone take you home from the hospital or clinic. This information is not intended to replace advice given to you by your health care provider. Make sure you discuss any  questions you have with your health care provider. Document Released: 12/19/2013 Document Revised: 11/24/2018 Document Reviewed: 10/31/2016 Elsevier Patient Education  2020 Elsevier Inc.   

## 2019-06-07 NOTE — Progress Notes (Signed)
06/07/19 9:42 AM   Jerome Joseph 1955/09/22 [id number]  Referring provider: Kirk Ruths, MD [facility] [place],  [place] [address]  CC: Elevated PSA  HPI: I saw Mr. Bolam in urology clinic in consultation for elevated PSA from Dr. Ouida Sills.  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  There are no prior PSAs to review.  He denies any family history of prostate cancer, however his he is a family history of breast cancer in his mother and maternal grandmother.  He denies any urinary symptoms or gross hematuria.  He takes Viagra for ED with good results.  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  There are no aggravating or alleviating factors.  Severity is mild.   PMH: Past Medical History:  Diagnosis Date  . Anxiety   . 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 Hypertension   . Panic attacks     Surgical History: Past Surgical History:  Procedure Laterality Date  . COLONOSCOPY  08/09/2007  . COLONOSCOPY WITH PROPOFOL N/A 08/21/2017   Procedure: COLONOSCOPY WITH PROPOFOL;  Surgeon: Manya Silvas, MD;  Location: [facility] ENDOSCOPY;  Service: Endoscopy;  Laterality: N/A;  . ESOPHAGOGASTRODUODENOSCOPY  08/09/2007  . ESOPHAGOGASTRODUODENOSCOPY (EGD) WITH PROPOFOL N/A 08/21/2017   Procedure: ESOPHAGOGASTRODUODENOSCOPY (EGD) WITH PROPOFOL;  Surgeon: Manya Silvas, MD;  Location: [facility] ENDOSCOPY;  Service: Endoscopy;  Laterality: N/A;  . FLEXIBLE SIGMOIDOSCOPY  09/09/2007  . TONSILLECTOMY      Allergies:  Allergies  Allergen Reactions  . Oxycodone-Acetaminophen Itching    Family History: Family History  Problem Relation Age of Onset  . Breast cancer Mother   . Breast cancer Maternal  Grandmother   . Lung cancer Brother   . Prostate cancer Neg Hx     Social History:  reports that he has never smoked. He has never used smokeless tobacco. He reports current alcohol use. He reports that he does not use drugs.  ROS: Please see flowsheet from today's date for complete review of systems.  Physical Exam: BP (!) 150/88 (BP Location: Left Arm, Patient Position: Sitting, Cuff Size: Normal)   Pulse (!) 105   Ht 5\' 10"  (1.778 m)   Wt 188 lb (85.3 kg)   BMI 26.98 kg/m    Constitutional:  Alert and oriented, No acute distress. Cardiovascular: No clubbing, cyanosis, or edema. Respiratory: Normal respiratory effort, no increased work of breathing. GI: Abdomen is soft, nontender, nondistended, no abdominal masses DRE: 40 g, smooth, no nodules Lymph: No cervical or inguinal lymphadenopathy. Skin: No rashes, bruises or suspicious lesions. Neurologic: Grossly intact, no focal deficits, moving all 4 extremities. Psychiatric: Normal mood and affect.  Laboratory Data: PSA 05/25/2019: 5.57  Pertinent Imaging: None to review  Assessment & Plan:   In summary, the patient is a 63 year old male with a distant history of head neck cancer treated with surgery/chemotherapy/radiation who was found to have a single isolated elevated PSA of 5.57.    We reviewed the implications of an elevated PSA and the uncertainty surrounding it. In general, a man's PSA increases with age and is produced by both normal and cancerous prostate tissue. The differential diagnosis for elevated PSA includes BPH, prostate  cancer, infection, recent intercourse/ejaculation, recent urethroscopic manipulation (foley placement/cystoscopy) or trauma, and prostatitis.   Management of an elevated PSA can include observation or prostate biopsy and we discussed this in detail. Our goal is to detect clinically significant prostate cancers, and manage with either active surveillance, surgery, or radiation for localized  disease. Risks of prostate biopsy include bleeding, infection (including life threatening sepsis), pain, and lower urinary symptoms. Hematuria, hematospermia, and blood in the stool are all common after biopsy and can persist up to 4 weeks.   Repeat PSA today with free/total ratio, call with results Pursue prostate biopsy if PSA remains elevated  Billey Co, MD  [place] [address], [place], [place] [address] [phone]

## 2019-06-08 LAB — PSA, TOTAL AND FREE
PSA, Free Pct: 14.3 %
PSA, Free: 1.07 ng/mL
Prostate Specific Ag, Serum: 7.5 ng/mL — ABNORMAL HIGH (ref 0.0–4.0)

## 2019-06-09 ENCOUNTER — Other Ambulatory Visit: Payer: Self-pay | Admitting: Urology

## 2019-06-09 ENCOUNTER — Telehealth: Payer: Self-pay

## 2019-06-09 MED ORDER — DIAZEPAM 5 MG PO TABS: 5.0000 mg | ORAL_TABLET | Freq: Once | ORAL | 0 refills | Status: DC | PRN

## 2019-06-09 NOTE — Telephone Encounter (Signed)
mychart sent.

## 2019-06-09 NOTE — Telephone Encounter (Signed)
Patient notified over phone as well

## 2019-06-09 NOTE — Telephone Encounter (Signed)
-----   Message from Billey Co, MD sent at 06/09/2019  8:11 AM EDT ----- PSA remains elevated at 7.5, need to schedule prostate biopsy. Please review instructions with him as well, thanks. I will send a valium to his pharmacy  Jerome Madrid, MD 06/09/2019

## 2019-06-10 NOTE — Telephone Encounter (Signed)
Spoke with patient and he will send me a my chart message and check in on Monday to see if anyone has CX   Sharyn Lull

## 2019-06-21 ENCOUNTER — Other Ambulatory Visit: Payer: BC Managed Care – PPO | Admitting: Urology

## 2019-06-23 ENCOUNTER — Ambulatory Visit (INDEPENDENT_AMBULATORY_CARE_PROVIDER_SITE_OTHER): Payer: BC Managed Care – PPO | Admitting: Urology

## 2019-06-23 ENCOUNTER — Other Ambulatory Visit: Payer: Self-pay

## 2019-06-23 ENCOUNTER — Other Ambulatory Visit: Payer: Self-pay | Admitting: Urology

## 2019-06-23 ENCOUNTER — Encounter: Payer: Self-pay | Admitting: Urology

## 2019-06-23 VITALS — BP 197/90 | HR 111 | Ht 69.0 in | Wt 188.0 lb

## 2019-06-23 DIAGNOSIS — R972 Elevated prostate specific antigen [PSA]: Secondary | ICD-10-CM | POA: Diagnosis not present

## 2019-06-23 MED ORDER — LEVOFLOXACIN 500 MG PO TABS
500.0000 mg | ORAL_TABLET | Freq: Once | ORAL | Status: AC
  Administered 2019-06-23: 500 mg via ORAL

## 2019-06-23 MED ORDER — GENTAMICIN SULFATE 40 MG/ML IJ SOLN
40.0000 mg | Freq: Once | INTRAMUSCULAR | Status: AC
  Administered 2019-06-23: 40 mg via INTRAMUSCULAR

## 2019-06-23 NOTE — Patient Instructions (Signed)
Transrectal Ultrasound-Guided Prostate Biopsy, Care After This sheet gives you information about how to care for yourself after your procedure. Your health care provider may also give you more specific instructions. If you have problems or questions, contact your health care provider. What can I expect after the procedure? After the procedure, it is common to have:  Pain and discomfort near your rectum, especially while sitting.  Pink-colored urine due to small amounts of blood in your urine.  A burning feeling while urinating.  Blood in your stool (feces) or bleeding from your rectum.  Blood in your semen. Follow these instructions at home: Medicines  Take over-the-counter and prescription medicines only as told by your health care provider.  If you were prescribed antibiotic medicine, take it as told by your health care provider. Do not stop taking the antibiotic even if you start to feel better. Activity  Do not drive for 24 hours if you were given a medicine to help you relax (sedative) during your procedure.  Return to your normal activities as told by your health care provider. Ask your health care provider what activities are safe for you.  Ask your health care provider when it is okay for you to resume sexual activity.  Do not lift anything that is heavier than 10 lb (4.5 kg), or the limit that you are told, until your health care provider says that it is safe. General instructions   Drink enough water to keep your urine pale yellow.  Watch your urine, stool, and semen for new or increased bleeding.  Keep all follow-up visits as told by your health care provider. This is important. Contact a health care provider if:  You have any of the following: ? Blood clots in your urine or stool. ? Blood in your urine more than 2 weeks after the procedure. ? Blood in your semen more than 2 months after the procedure. ? New or increased bleeding in your urine, stool, or semen.  ? Severe pain in your abdomen.  Your urine smells bad or unusual.  You have trouble urinating.  Your lower abdomen feels firm.  You have problems getting an erection.  You have nausea or you vomit. Get help right away if:  You have a fever or chills. This could be a sign of infection.  You have bright red urine.  You have severe pain that does not get better with medicine.  You cannot urinate. Summary  After this procedure, it is common to have pain and discomfort around your rectum, especially while sitting.  You may have blood in your urine and stool after the procedure.  It is common to have blood in your semen for 1-2 months after this procedure.  If you were prescribed antibiotic medicine, take it as told by your health care provider. Do not stop taking the antibiotic even if you start to feel better.  Get help right away if you have a fever or chills. This could be a sign of infection. This information is not intended to replace advice given to you by your health care provider. Make sure you discuss any questions you have with your health care provider. Document Released: 01/26/2017 Document Revised: 11/24/2018 Document Reviewed: 01/26/2017 Elsevier Patient Education  2020 [employer].

## 2019-06-23 NOTE — Progress Notes (Signed)
   06/23/19  Indication: Elevated PSA 7.5  Prostate Biopsy Procedure   Informed consent was obtained, and we discussed the risks of bleeding and infection/sepsis. A time out was performed to ensure correct patient identity.  Pre-Procedure: - Last PSA Level: 7.5, 14% free - Gentamicin and levaquin given for antibiotic prophylaxis - Transrectal Ultrasound performed revealing a 50 gm prostate, PSA density 0.15 - No significant hypoechoic or median lobe noted  Procedure: - Prostate block performed using 10 cc 1% lidocaine and biopsies taken from sextant areas, a total of 12 under ultrasound guidance.  Post-Procedure: - Patient tolerated the procedure well - He was counseled to seek immediate medical attention if experiences significant bleeding, fevers, or severe pain - Return in one week to discuss biopsy results  Assessment/ Plan: Will follow up in 1-2 weeks to discuss pathology  Nickolas Madrid, MD 06/23/2019

## 2019-06-29 ENCOUNTER — Ambulatory Visit: Payer: Self-pay | Admitting: Urology

## 2019-07-01 LAB — ANATOMIC PATHOLOGY REPORT: PDF Image: 0

## 2019-07-04 ENCOUNTER — Telehealth: Payer: Self-pay | Admitting: Urology

## 2019-07-04 ENCOUNTER — Encounter: Payer: Self-pay | Admitting: Urology

## 2019-07-04 ENCOUNTER — Other Ambulatory Visit: Payer: Self-pay

## 2019-07-04 ENCOUNTER — Ambulatory Visit: Payer: BC Managed Care – PPO | Admitting: Urology

## 2019-07-04 VITALS — BP 163/79 | HR 114 | Ht 70.0 in | Wt 188.0 lb

## 2019-07-04 DIAGNOSIS — R972 Elevated prostate specific antigen [PSA]: Secondary | ICD-10-CM

## 2019-07-04 NOTE — Telephone Encounter (Signed)
Faxed per patient's request via Epic

## 2019-07-04 NOTE — Progress Notes (Signed)
   07/04/2019 3:58 PM   Jerome Joseph 1956-01-09 [id number]  Reason for visit: Discuss prostate biopsy results  HPI: I saw [name] back in urology clinic to discuss his prostate biopsy results.  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  This showed a 50 g prostate with a PSA density of 0.15, and biopsy was benign with a few cores showing some acute and chronic inflammation.  He has recovered well from his biopsy and denies any hematuria, fevers, or dysuria.  He was most bothered by the Valium, but did very well from the biopsy itself.  We had a long conversation about his biopsy results today.  We discussed the <20% chance of a false negative.  We discussed the need for ongoing monitoring of the PSA with a repeat PSA in 6 months.  If the PSA trend was rising significantly, I would recommend pursuing a prostate MRI and possible biopsy of any abnormal areas.  If the PSA remains stable, we can return to yearly PSA/DRE screening.  RTC 6 months with PSA prior  A total of 15 minutes were spent face-to-face with the patient, greater than 50% was spent in patient education, counseling, and coordination of care regarding prostate biopsy results and PSA screening.  Billey Co, [facility] [address] [place], [place] [address] [phone]

## 2019-07-04 NOTE — Telephone Encounter (Signed)
-----   Message from Billey Co, MD sent at 07/04/2019  4:02 PM EST ----- Regarding: fwd results Can you forward a copy of my note from today and his prostate biopsy results to his PCP Frazier Richards at [facility], and his otolaryngologist Antonietta Breach at [facility]? Thanks  Nickolas Madrid, MD 07/04/2019

## 2019-07-11 ENCOUNTER — Other Ambulatory Visit: Payer: BC Managed Care – PPO | Admitting: Urology

## 2019-07-25 ENCOUNTER — Ambulatory Visit: Payer: BC Managed Care – PPO | Admitting: Urology

## 2019-11-16 ENCOUNTER — Ambulatory Visit: Payer: BC Managed Care – PPO | Admitting: Urology

## 2019-11-16 ENCOUNTER — Encounter: Payer: Self-pay | Admitting: Urology

## 2019-11-16 ENCOUNTER — Other Ambulatory Visit: Payer: Self-pay

## 2019-11-16 VITALS — BP 153/91 | HR 102 | Ht 70.0 in | Wt 186.0 lb

## 2019-11-16 DIAGNOSIS — R319 Hematuria, unspecified: Secondary | ICD-10-CM | POA: Diagnosis not present

## 2019-11-16 DIAGNOSIS — R972 Elevated prostate specific antigen [PSA]: Secondary | ICD-10-CM | POA: Diagnosis not present

## 2019-11-16 NOTE — Progress Notes (Signed)
   11/16/2019 8:35 AM   [name] [birth year] [id number]  Reason for visit: Hematuria  HPI: I saw Mr. Jerome Joseph today as an add-on in urology clinic for hematuria.  He is a 64 year old male who previously underwent a prostate biopsy in November 2020 for an elevated PSA of 7.5 which showed 50 g prostate with only benign prostatic tissue.  Plan was for repeat PSA in May 2021.  He reports 3 days of pink to light red urine over the last few days with no other symptoms.  He specifically denies any dysuria, flank pain, or abdominal pain.  He is a never smoker and denies any other carcinogenic exposures.  He reports his pink urine has completely cleared up over the last 24 hours and he is voiding yellow urine spontaneously without any issues.  He denies any urinary symptoms.  Urinalysis today is benign with 0-2 RBCs, 0 WBCs, no bacteria.  We discussed common possible etiologies of hematuria including BPH, malignancy, urolithiasis, medical renal disease, and idiopathic. Standard workup recommended by the AUA includes imaging with CT urogram to assess the upper tracts, and cystoscopy. Cytology is performed on patient's with gross hematuria to look for malignant cells in the urine.  CT urogram and cystoscopy for hematuria work-up Keep follow-up May 2021 with PSA prior  Billey Co, MD  [facility] [address], [place], [place] [address] [phone]

## 2019-11-16 NOTE — Patient Instructions (Signed)

## 2019-11-17 LAB — MICROSCOPIC EXAMINATION: Bacteria, UA: NONE SEEN

## 2019-11-17 LAB — URINALYSIS, COMPLETE
Bilirubin, UA: NEGATIVE
Glucose, UA: NEGATIVE
Ketones, UA: NEGATIVE
Nitrite, UA: NEGATIVE
Protein,UA: NEGATIVE
Specific Gravity, UA: 1.02 (ref 1.005–1.030)
Urobilinogen, Ur: 1 mg/dL (ref 0.2–1.0)
pH, UA: 7 (ref 5.0–7.5)

## 2019-11-21 ENCOUNTER — Other Ambulatory Visit: Payer: Self-pay | Admitting: *Deleted

## 2019-11-21 DIAGNOSIS — R319 Hematuria, unspecified: Secondary | ICD-10-CM

## 2019-11-22 ENCOUNTER — Other Ambulatory Visit: Payer: Self-pay

## 2019-11-22 ENCOUNTER — Other Ambulatory Visit
Admission: RE | Admit: 2019-11-22 | Discharge: 2019-11-22 | Disposition: A | Discharge status: Home / Self Care | Payer: BC Managed Care – PPO | Source: Home / Self Care | Attending: Physician Assistant | Admitting: Physician Assistant

## 2019-11-22 ENCOUNTER — Ambulatory Visit
Admission: RE | Admit: 2019-11-22 | Discharge: 2019-11-22 | Disposition: A | Discharge status: Home / Self Care | Payer: BC Managed Care – PPO | Source: Ambulatory Visit | Attending: Urology | Admitting: Urology

## 2019-11-22 DIAGNOSIS — R319 Hematuria, unspecified: Secondary | ICD-10-CM

## 2019-11-22 LAB — CREATININE, SERUM
Creatinine, Ser: 0.85 mg/dL (ref 0.61–1.24)
GFR calc Af Amer: >60 mL/min (ref >60)
GFR calc non Af Amer: >60 mL/min (ref >60)

## 2019-11-22 MED ORDER — IOHEXOL 300 MG/ML  SOLN
150.0000 mL | Freq: Once | INTRAMUSCULAR | Status: AC | PRN
  Administered 2019-11-22: 11:00:00 125 mL via INTRAVENOUS

## 2019-11-25 ENCOUNTER — Ambulatory Visit: Payer: BC Managed Care – PPO

## 2019-11-30 ENCOUNTER — Encounter: Payer: Self-pay | Admitting: Urology

## 2019-11-30 ENCOUNTER — Other Ambulatory Visit: Payer: Self-pay | Admitting: Urology

## 2019-12-07 ENCOUNTER — Other Ambulatory Visit: Payer: Self-pay | Admitting: Urology

## 2019-12-12 ENCOUNTER — Other Ambulatory Visit: Payer: Self-pay

## 2019-12-12 ENCOUNTER — Ambulatory Visit: Payer: BC Managed Care – PPO | Admitting: Urology

## 2019-12-12 VITALS — BP 135/80 | HR 112 | Ht 70.0 in | Wt 186.0 lb

## 2019-12-12 DIAGNOSIS — R319 Hematuria, unspecified: Secondary | ICD-10-CM

## 2019-12-12 DIAGNOSIS — R972 Elevated prostate specific antigen [PSA]: Secondary | ICD-10-CM

## 2019-12-12 MED ORDER — LIDOCAINE HCL URETHRAL/MUCOSAL 2 % EX GEL
1.0000 "application " | Freq: Once | CUTANEOUS | Status: AC
  Administered 2019-12-12: 1 via URETHRAL

## 2019-12-12 NOTE — Progress Notes (Signed)
Cystoscopy Procedure Note:  Indication: Gross hematuria  After informed consent and discussion of the procedure and its risks, JODEN STOUDT was positioned and prepped in the standard fashion. Cystoscopy was performed with a flexible cystoscope. The urethra, bladder neck and entire bladder was visualized in a standard fashion. The prostate was moderate in size with a small intravesical lobe with some dilated blood vessels. The ureteral orifices were visualized in their normal location and orientation.  No other abnormalities on retroflexion.  Mucosa grossly normal throughout.  Cytology sent.  Imaging: No urologic abnormalities, I personally reviewed the CT  Findings: Subtle dilation of blood vessels on intravesical median lobe of prostate likely source of hematuria, cystoscopy otherwise normal  Assessment and Plan: Consider finasteride in the future if recurrent gross hematuria episodes RTC 2 to 3 months for PSA with reflex to free for routine PSA screening with history of elevated PSA and negative prostate biopsy  Nickolas Madrid, MD 12/12/2019

## 2019-12-13 LAB — URINALYSIS, COMPLETE
Bilirubin, UA: NEGATIVE
Glucose, UA: NEGATIVE
Ketones, UA: NEGATIVE
Nitrite, UA: NEGATIVE
Protein,UA: NEGATIVE
Specific Gravity, UA: 1.02 (ref 1.005–1.030)
Urobilinogen, Ur: 1 mg/dL (ref 0.2–1.0)
pH, UA: 7 (ref 5.0–7.5)

## 2019-12-13 LAB — MICROSCOPIC EXAMINATION: Bacteria, UA: NONE SEEN

## 2019-12-19 ENCOUNTER — Other Ambulatory Visit: Payer: Self-pay | Admitting: Urology

## 2019-12-20 ENCOUNTER — Telehealth: Payer: Self-pay

## 2019-12-20 NOTE — Telephone Encounter (Signed)
-----   Message from Billey Co, MD sent at 12/20/2019  8:14 AM EDT -----   ----- Message ----- From: Margarita Grizzle Sent: 12/19/2019   9:25 AM EDT To: Billey Co, MD

## 2019-12-20 NOTE — Progress Notes (Signed)
Great news, no cancer cells seen in urine sample. Keep follow up as scheduled for PSA monitoring  Nickolas Madrid, MD 12/20/2019

## 2019-12-20 NOTE — Telephone Encounter (Signed)
Called pt no answer. Left detailed message for pt per DPR informing him of the information below.    Great news, no cancer cells seen in urine sample. Keep follow up as scheduled for PSA monitoring  Nickolas Madrid, MD 12/20/2019

## 2019-12-29 ENCOUNTER — Other Ambulatory Visit: Payer: BC Managed Care – PPO

## 2020-01-04 ENCOUNTER — Ambulatory Visit: Payer: BC Managed Care – PPO | Admitting: Urology

## 2020-02-10 ENCOUNTER — Other Ambulatory Visit: Payer: Self-pay

## 2020-02-10 ENCOUNTER — Other Ambulatory Visit: Payer: BC Managed Care – PPO

## 2020-02-10 DIAGNOSIS — R972 Elevated prostate specific antigen [PSA]: Secondary | ICD-10-CM

## 2020-02-11 LAB — FPSA% REFLEX
% FREE PSA: 10.1 %
PSA, FREE: 0.88 ng/mL

## 2020-02-11 LAB — PSA TOTAL (REFLEX TO FREE): Prostate Specific Ag, Serum: 8.7 ng/mL — ABNORMAL HIGH (ref 0.0–4.0)

## 2020-02-15 ENCOUNTER — Ambulatory Visit (INDEPENDENT_AMBULATORY_CARE_PROVIDER_SITE_OTHER): Payer: BC Managed Care – PPO | Admitting: Urology

## 2020-02-15 ENCOUNTER — Other Ambulatory Visit: Payer: Self-pay

## 2020-02-15 ENCOUNTER — Encounter: Payer: Self-pay | Admitting: Urology

## 2020-02-15 VITALS — BP 155/94 | HR 106 | Ht 70.0 in | Wt 185.0 lb

## 2020-02-15 DIAGNOSIS — N3281 Overactive bladder: Secondary | ICD-10-CM | POA: Diagnosis not present

## 2020-02-15 DIAGNOSIS — R310 Gross hematuria: Secondary | ICD-10-CM | POA: Diagnosis not present

## 2020-02-15 DIAGNOSIS — R972 Elevated prostate specific antigen [PSA]: Secondary | ICD-10-CM

## 2020-02-15 NOTE — Progress Notes (Signed)
   02/15/2020 4:17 PM   [name] [birth year] [id number]  Reason for visit: Follow up elevated PSA, history of hematuria  HPI: I saw Mr. Flythe back in urology clinic for history of elevated PSA, and history of hematuria.  Is a 64 year old healthy male who originally underwent a prostate biopsy November 2020 for an elevated PSA of 7.5 which showed a 50 g prostate with only benign prostate tissue, and some chronic inflammation.  He also developed some hematuria in April 2020 that was evaluated with CT urogram which was normal, and a cystoscopy 4/26 which showed some friable prostate tissue but was otherwise normal.  Cytology was also negative.  He reports he has had some worsening urgency to urinate over the last few months, but denies any other changes in his health.  He drinks quite a bit of unsweet tea during the day.  Repeat PSA on 02/10/2020 had increased slightly to 8.7(10.1% free).  We a long conversation about these findings today.  We discussed possible etiologies including undetected prostate malignancy missed on prior biopsy, increased PSA after cystoscopy 2 months ago, or regular fluctuations in PSA as well as possible etiologies for variation.  We discussed options at length including repeat PSA in 3 months versus prostate MRI.  He would like to pursue prostate MRI, as his previous head and neck malignancy was initially missed on first biopsy.  I also recommended cutting back on unsweet tea to see if his urinary urgency improves.  Prostate MRI, call with results  I spent 30 total minutes on the day of the encounter including pre-visit review of the medical record, face-to-face time with the patient, and post visit ordering of labs/imaging/tests.  Billey Co, [facility] [address], [place], [place] [address] [phone]

## 2020-02-28 ENCOUNTER — Ambulatory Visit
Admission: RE | Admit: 2020-02-28 | Discharge: 2020-02-28 | Disposition: A | Discharge status: Home / Self Care | Payer: BC Managed Care – PPO | Source: Ambulatory Visit | Attending: Urology | Admitting: Urology

## 2020-02-28 ENCOUNTER — Other Ambulatory Visit: Payer: Self-pay

## 2020-02-28 DIAGNOSIS — R972 Elevated prostate specific antigen [PSA]: Secondary | ICD-10-CM | POA: Insufficient documentation

## 2020-02-28 MED ORDER — GADOBUTROL 1 MMOL/ML IV SOLN
7.5000 mL | Freq: Once | INTRAVENOUS | Status: AC | PRN
  Administered 2020-02-28: 7.5 mL via INTRAVENOUS

## 2020-03-02 ENCOUNTER — Telehealth: Payer: Self-pay | Admitting: *Deleted

## 2020-03-02 ENCOUNTER — Ambulatory Visit: Payer: BC Managed Care – PPO

## 2020-03-02 NOTE — Telephone Encounter (Signed)
Patient just got your message regarding MRI results-returning call. Please advise.

## 2020-06-21 ENCOUNTER — Encounter: Payer: Self-pay | Admitting: Dermatology

## 2020-06-21 ENCOUNTER — Ambulatory Visit: Payer: BC Managed Care – PPO | Admitting: Dermatology

## 2020-06-21 ENCOUNTER — Other Ambulatory Visit: Payer: Self-pay

## 2020-06-21 DIAGNOSIS — Z1283 Encounter for screening for malignant neoplasm of skin: Secondary | ICD-10-CM

## 2020-06-21 DIAGNOSIS — D229 Melanocytic nevi, unspecified: Secondary | ICD-10-CM

## 2020-06-21 DIAGNOSIS — L821 Other seborrheic keratosis: Secondary | ICD-10-CM

## 2020-06-21 DIAGNOSIS — C44629 Squamous cell carcinoma of skin of left upper limb, including shoulder: Secondary | ICD-10-CM | POA: Diagnosis not present

## 2020-06-21 DIAGNOSIS — L820 Inflamed seborrheic keratosis: Secondary | ICD-10-CM

## 2020-06-21 DIAGNOSIS — C4492 Squamous cell carcinoma of skin, unspecified: Secondary | ICD-10-CM

## 2020-06-21 DIAGNOSIS — L814 Other melanin hyperpigmentation: Secondary | ICD-10-CM

## 2020-06-21 DIAGNOSIS — Z85828 Personal history of other malignant neoplasm of skin: Secondary | ICD-10-CM

## 2020-06-21 DIAGNOSIS — L570 Actinic keratosis: Secondary | ICD-10-CM

## 2020-06-21 DIAGNOSIS — D492 Neoplasm of unspecified behavior of bone, soft tissue, and skin: Secondary | ICD-10-CM

## 2020-06-21 DIAGNOSIS — L578 Other skin changes due to chronic exposure to nonionizing radiation: Secondary | ICD-10-CM | POA: Diagnosis not present

## 2020-06-21 HISTORY — DX: Squamous cell carcinoma of skin, unspecified: C44.92

## 2020-06-21 NOTE — Progress Notes (Signed)
Follow-Up Visit   Subjective  Jerome Joseph is a 64 y.o. male who presents for the following: Annual Exam (Upper body skin exam) and Actinic Keratosis (face, hx of LN2 and PDT). The patient presents for Upper Body Skin Exam (UBSE) for skin cancer screening and mole check.  The following portions of the chart were reviewed this encounter and updated as appropriate:  Tobacco  Allergies  Meds  Problems  Med Hx  Surg Hx  Fam Hx     Review of Systems:  No other skin or systemic complaints except as noted in HPI or Assessment and Plan.  Objective  Well appearing patient in no apparent distress; mood and affect are within normal limits.  All skin waist up examined.  Objective  face, hands, wrist x 30 (30): Pink scaly macules   Objective  Right arm x 6 (6): Erythematous keratotic or waxy stuck-on papule or plaque.   Objective  L dorsum hand medial: 1.0cm hyperkeratotic pap  Objective  L dorsum hand proximal to 4th finger MCP: 1.2cm hyperkeratotic pap   Assessment & Plan    Actinic Damage - chronic, secondary to cumulative UV radiation exposure/sun exposure over time - diffuse scaly erythematous macules with underlying dyspigmentation - Recommend daily broad spectrum sunscreen SPF 30+ to sun-exposed areas, reapply every 2 hours as needed.  - Call for new or changing lesions.  Skin cancer screening performed today.  Seborrheic Keratoses - Stuck-on, waxy, tan-brown papules and plaques  - Discussed benign etiology and prognosis. - Observe - Call for any changes  Lentigines - Scattered tan macules - Discussed due to sun exposure - Benign, observe - Call for any changes  Melanocytic Nevi - Tan-brown and/or pink-flesh-colored symmetric macules and papules - Benign appearing on exam today - Observation - Call clinic for new or changing moles - Recommend daily use of broad spectrum spf 30+ sunscreen to sun-exposed areas.   History of Basal Cell Carcinoma of  the Skin - No evidence of recurrence today - Recommend regular full body skin exams - Recommend daily broad spectrum sunscreen SPF 30+ to sun-exposed areas, reapply every 2 hours as needed.  - Call if any new or changing lesions are noted between office visits - R lat nose/medial cheek 1.3cm inf to med canthus  AK (actinic keratosis) (30) face, hands, wrist x 30  Plan PDT to face I 1 month  Destruction of lesion - face, hands, wrist x 30 Complexity: simple   Destruction method: cryotherapy   Informed consent: discussed and consent obtained   Timeout:  patient name, date of birth, surgical site, and procedure verified Lesion destroyed using liquid nitrogen: Yes   Region frozen until ice ball extended beyond lesion: Yes   Outcome: patient tolerated procedure well with no complications   Post-procedure details: wound care instructions given    Inflamed seborrheic keratosis (6) Right arm x 6  Destruction of lesion - Right arm x 6 Complexity: simple   Destruction method: cryotherapy   Informed consent: discussed and consent obtained   Timeout:  patient name, date of birth, surgical site, and procedure verified Lesion destroyed using liquid nitrogen: Yes   Region frozen until ice ball extended beyond lesion: Yes   Outcome: patient tolerated procedure well with no complications   Post-procedure details: wound care instructions given    Neoplasm of skin (2) L dorsum hand medial  Epidermal / dermal shaving  Lesion diameter (cm):  1 Informed consent: discussed and consent obtained   Timeout: patient  name, date of birth, surgical site, and procedure verified   Procedure prep:  Patient was prepped and draped in usual sterile fashion Prep type:  Isopropyl alcohol Anesthesia: the lesion was anesthetized in a standard fashion   Anesthetic:  1% lidocaine w/ epinephrine 1-100,000 buffered w/ 8.4% NaHCO3 Instrument used: flexible razor blade   Hemostasis achieved with: pressure,  aluminum chloride and electrodesiccation   Outcome: patient tolerated procedure well   Post-procedure details: sterile dressing applied and wound care instructions given   Dressing type: bandage and bacitracin    Destruction of lesion Complexity: extensive   Destruction method: electrodesiccation and curettage   Informed consent: discussed and consent obtained   Timeout:  patient name, date of birth, surgical site, and procedure verified Procedure prep:  Patient was prepped and draped in usual sterile fashion Prep type:  Isopropyl alcohol Anesthesia: the lesion was anesthetized in a standard fashion   Anesthetic:  1% lidocaine w/ epinephrine 1-100,000 buffered w/ 8.4% NaHCO3 Curettage performed in three different directions: Yes   Electrodesiccation performed over the curetted area: Yes   Lesion length (cm):  1 Lesion width (cm):  1 Margin per side (cm):  0.2 Final wound size (cm):  1.4 Hemostasis achieved with:  pressure, aluminum chloride and electrodesiccation Outcome: patient tolerated procedure well with no complications   Post-procedure details: sterile dressing applied and wound care instructions given   Dressing type: bandage and petrolatum    Specimen 1 - Surgical pathology Differential Diagnosis: D48.5 R/O SCC Check Margins: No 1.0cm hyperkeratotic pap EDC  L dorsum hand proximal to 4th finger MCP  Epidermal / dermal shaving  Lesion diameter (cm):  1.2 Informed consent: discussed and consent obtained   Timeout: patient name, date of birth, surgical site, and procedure verified   Procedure prep:  Patient was prepped and draped in usual sterile fashion Prep type:  Isopropyl alcohol Anesthesia: the lesion was anesthetized in a standard fashion   Anesthetic:  1% lidocaine w/ epinephrine 1-100,000 buffered w/ 8.4% NaHCO3 Instrument used: flexible razor blade   Hemostasis achieved with: pressure, aluminum chloride and electrodesiccation   Outcome: patient tolerated  procedure well   Post-procedure details: sterile dressing applied and wound care instructions given   Dressing type: bandage and petrolatum    Destruction of lesion Complexity: extensive   Destruction method: electrodesiccation and curettage   Informed consent: discussed and consent obtained   Timeout:  patient name, date of birth, surgical site, and procedure verified Procedure prep:  Patient was prepped and draped in usual sterile fashion Prep type:  Isopropyl alcohol Anesthesia: the lesion was anesthetized in a standard fashion   Anesthetic:  1% lidocaine w/ epinephrine 1-100,000 buffered w/ 8.4% NaHCO3 Curettage performed in three different directions: Yes   Electrodesiccation performed over the curetted area: Yes   Lesion length (cm):  1.2 Lesion width (cm):  1.2 Margin per side (cm):  0.2 Final wound size (cm):  1.6 Hemostasis achieved with:  pressure, aluminum chloride and electrodesiccation Outcome: patient tolerated procedure well with no complications   Post-procedure details: sterile dressing applied and wound care instructions given   Dressing type: bandage and petrolatum    Specimen 2 - Surgical pathology Differential Diagnosis: D48.5 R/O SCC Check Margins: No 1.2cm hyperkeratotic pap EDC  R/O SCC shave removal and EDC x 2  Return in about 1 month (around 07/21/2020) for PDT face, 59m for AK f/u and TBSE.   I, Othelia Pulling, RMA, am acting as scribe for Sarina Ser, MD .  Documentation: I have reviewed the above documentation for accuracy and completeness, and I agree with the above.  Sarina Ser, MD

## 2020-06-21 NOTE — Patient Instructions (Signed)

## 2020-06-22 ENCOUNTER — Encounter: Payer: Self-pay | Admitting: Dermatology

## 2020-06-26 ENCOUNTER — Telehealth: Payer: Self-pay

## 2020-06-26 NOTE — Telephone Encounter (Signed)
-----   Message from Ralene Bathe, MD sent at 06/26/2020  8:52 AM EST ----- Diagnosis 1. 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 Skin , l dorsum hand proximal to 4th finger MCP WELL DIFFERENTIATED SQUAMOUS CELL CARCINOMA WITH SUPERFICIAL INFILTRATION  1&2 - both cancer - SCC Both already treated Recheck next visit

## 2020-06-26 NOTE — Telephone Encounter (Signed)
LM on VM to return my call. 

## 2020-06-27 NOTE — Telephone Encounter (Signed)
Patient advised of biopsy results.

## 2020-07-23 ENCOUNTER — Other Ambulatory Visit: Payer: Self-pay

## 2020-07-23 ENCOUNTER — Ambulatory Visit: Payer: BC Managed Care – PPO

## 2020-07-23 DIAGNOSIS — L570 Actinic keratosis: Secondary | ICD-10-CM | POA: Diagnosis not present

## 2020-07-23 MED ORDER — AMINOLEVULINIC ACID HCL 20 % EX SOLR
1.0000 "application " | Freq: Once | CUTANEOUS | Status: AC
  Administered 2020-07-23: 354 mg via TOPICAL

## 2020-07-23 NOTE — Patient Instructions (Signed)

## 2020-07-23 NOTE — Progress Notes (Signed)
1. AK (actinic keratosis) face  Photodynamic therapy - face Procedure discussed: discussed risks, benefits, side effects. and alternatives   Prep: site scrubbed/prepped with acetone   Location:  Face Number of lesions:  Multiple Type of treatment:  Blue light Aminolevulinic Acid (see MAR for details): Levulan Number of Levulan sticks used:  1 Incubation time (minutes):  60 Number of minutes under lamp:  16 Number of seconds under lamp:  40 Cooling:  Floor fan Outcome: patient tolerated procedure well with no complications   Post-procedure details: sunscreen applied and aftercare instructions given to patient

## 2020-08-02 ENCOUNTER — Other Ambulatory Visit: Payer: Self-pay

## 2020-08-02 ENCOUNTER — Encounter: Payer: Self-pay | Admitting: Urology

## 2020-08-02 ENCOUNTER — Ambulatory Visit: Payer: BC Managed Care – PPO | Admitting: Urology

## 2020-08-02 VITALS — BP 139/88 | HR 91 | Ht 70.0 in | Wt 184.0 lb

## 2020-08-02 DIAGNOSIS — R310 Gross hematuria: Secondary | ICD-10-CM | POA: Diagnosis not present

## 2020-08-02 DIAGNOSIS — R972 Elevated prostate specific antigen [PSA]: Secondary | ICD-10-CM | POA: Diagnosis not present

## 2020-08-02 NOTE — Progress Notes (Signed)
   08/02/2020 10:14 AM   Jerome Joseph [birth year] [id number]  Reason for visit: Follow up elevated PSA, history of hematuria, mild urinary symptoms  HPI: I saw Jerome Joseph back in urology clinic for a number of urologic issues.  Briefly he is a 64 year old very healthy male who underwent a prostate biopsy November 2020 for an elevated PSA of 7.5 which showed a 50 g prostate with only benign prostate tissue and some chronic inflammation.  He then developed gross hematuria in April 2021  that was evaluated with CT urogram that was normal and a cystoscopy that showed some friable prostate tissue but was otherwise normal.  Cytology was also negative.  He also has had some mild urinary urgency and frequency over the last few months, but those have improved recently and he really has minimal urinary complaints aside from some delay in starting his stream.  A repeat PSA in June 2021 had increased slightly to 8.7(10% free) and we opted for a prostate MRI for further evaluation.  Prostate MRI was performed on 02/28/2020 and showed no suspicious lesions.  I personally viewed and interpreted the MRI and there are no areas suspicious for prostate cancer.  Prostate volume was 67 g equating to a PSA density of less than 0.15.  A repeat PSA was drawn in October 2021 that had decreased to 6.56 which is a full point lower than last year.  Overall, he feels he is doing very well and really has no complaints today.  With a long conversation about his history of elevated PSA and hematuria, need for monitoring of the PSA going forward.  Certainly his negative prostate biopsy and negative prostate MRI are both reassuring, as well as the stable to decreased PSA.  Regarding his hematuria, he had a negative work-up in April 2021, and I suspect this was prostatic in origin based on his friable prostatic vessels.  We discussed return precautions at length including recurrent gross hematuria or clots or worsening urinary  symptoms.  RTC 9 months with PSA prior, if stable likely space to yearly screening  Billey Co, [facility] [address] [place], [place] [address] [phone]

## 2020-10-15 ENCOUNTER — Ambulatory Visit: Payer: BC Managed Care – PPO | Admitting: Dermatology

## 2020-10-15 ENCOUNTER — Encounter: Payer: BC Managed Care – PPO | Admitting: Dermatology

## 2020-11-22 ENCOUNTER — Ambulatory Visit: Payer: BC Managed Care – PPO | Admitting: Dermatology

## 2020-12-18 ENCOUNTER — Ambulatory Visit: Payer: BC Managed Care – PPO | Attending: Internal Medicine

## 2020-12-18 DIAGNOSIS — Z23 Encounter for immunization: Secondary | ICD-10-CM

## 2020-12-18 NOTE — Progress Notes (Signed)
   Covid-19 Vaccination Clinic  Name:  MANCE VALLEJO    MRN: [id number] DOB: 1956-06-10  12/18/2020  Mr. Caudell was observed post Covid-19 immunization for 15 minutes without incident. He was provided with Vaccine Information Sheet and instruction to access the V-Safe system.   Mr. Brandon was instructed to call 911 with any severe reactions post vaccine: Marland Kitchen Difficulty breathing  . Swelling of face and throat  . A fast heartbeat  . A bad rash all over body  . Dizziness and weakness   Immunizations Administered    Name Date Dose VIS Date Route   PFIZER Comrnaty(Gray TOP) Covid-19 Vaccine 12/18/2020  1:53 PM 0.3 mL 07/26/2020 Intramuscular   Manufacturer: [employer]   Lot: [id number]   NDC: [place], PharmD, MBA Clinical Pharmacist

## 2020-12-19 ENCOUNTER — Other Ambulatory Visit: Payer: Self-pay

## 2020-12-19 MED ORDER — PFIZER-BIONT COVID-19 VAC-TRIS 30 MCG/0.3ML IM SUSP
INTRAMUSCULAR | 0 refills | Status: DC
  Filled 2020-12-19: qty 0.3, 1d supply, fill #0

## 2021-01-03 ENCOUNTER — Ambulatory Visit: Payer: BC Managed Care – PPO | Admitting: Dermatology

## 2021-04-24 ENCOUNTER — Other Ambulatory Visit: Payer: Self-pay

## 2021-04-24 ENCOUNTER — Other Ambulatory Visit: Payer: BC Managed Care – PPO

## 2021-04-24 DIAGNOSIS — R972 Elevated prostate specific antigen [PSA]: Secondary | ICD-10-CM

## 2021-04-25 LAB — PSA TOTAL (REFLEX TO FREE): Prostate Specific Ag, Serum: 9.2 ng/mL — ABNORMAL HIGH (ref 0.0–4.0)

## 2021-04-25 LAB — FPSA% REFLEX
% FREE PSA: 11.5 %
PSA, FREE: 1.06 ng/mL

## 2021-05-01 ENCOUNTER — Encounter: Payer: Self-pay | Admitting: Urology

## 2021-05-01 ENCOUNTER — Ambulatory Visit: Payer: BC Managed Care – PPO | Admitting: Urology

## 2021-05-01 ENCOUNTER — Other Ambulatory Visit: Payer: Self-pay

## 2021-05-01 VITALS — BP 154/80 | HR 106 | Ht 70.0 in | Wt 190.0 lb

## 2021-05-01 DIAGNOSIS — R972 Elevated prostate specific antigen [PSA]: Secondary | ICD-10-CM | POA: Diagnosis not present

## 2021-05-01 DIAGNOSIS — N138 Other obstructive and reflux uropathy: Secondary | ICD-10-CM | POA: Diagnosis not present

## 2021-05-01 DIAGNOSIS — R310 Gross hematuria: Secondary | ICD-10-CM

## 2021-05-01 DIAGNOSIS — N401 Enlarged prostate with lower urinary tract symptoms: Secondary | ICD-10-CM | POA: Diagnosis not present

## 2021-05-01 NOTE — Patient Instructions (Signed)

## 2021-05-01 NOTE — Progress Notes (Signed)
   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 Briefly, very healthy male who underwent a prostate biopsy November 2020 for an elevated PSA of 7.5 which showed a 50 g prostate with only benign prostate tissue and some chronic inflammation.  He then developed gross hematuria in April 2021  that was evaluated with CT urogram that was normal and a cystoscopy that showed some friable prostate tissue but was otherwise normal.  Cytology was also negative.  A repeat PSA in June 2021 had increased slightly to 8.7(10% free) and we opted for a prostate MRI for further evaluation.  Prostate MRI was performed on 02/28/2020 and showed no suspicious lesions.  I personally viewed and interpreted the MRI and there are no areas suspicious for prostate cancer.  Prostate volume was 67 g equating to a PSA density of less than 0.15.  Most recent PSA was 9.2 which is essentially stable from 8.7 last year.  He denies any changes over the last year.  He has minimal to no urinary symptoms, and only urinary complaint is occasional mild hesitation before starting his stream.  He still has some intermittent light pink urine likely secondary to his friable prostate and BPH.  We discussed options including finasteride, repeat cystoscopy or CT, or observation.  He is very averse to medications unless absolutely necessary, and would like to continue observation.  DRE today, 65 g benign prostate with no nodules or masses.  We reviewed the risks and benefits of PSA screening, and the controversies surrounding PSA.  With his extensive history of mildly elevated PSA and persistent negative work-up, I recommended continuing PSA on a yearly basis.  RTC 1 year for PSA/DRE Return precautions discussed at length, consider finasteride in  the future if worsening urinary symptoms or recurrent gross hematuria   Billey Co, MD  [facility] [address] [place], [place] [address] [phone]

## 2021-05-02 ENCOUNTER — Ambulatory Visit: Payer: BC Managed Care – PPO | Admitting: Dermatology

## 2021-05-02 DIAGNOSIS — L578 Other skin changes due to chronic exposure to nonionizing radiation: Secondary | ICD-10-CM

## 2021-05-02 DIAGNOSIS — L219 Seborrheic dermatitis, unspecified: Secondary | ICD-10-CM

## 2021-05-02 DIAGNOSIS — D229 Melanocytic nevi, unspecified: Secondary | ICD-10-CM

## 2021-05-02 DIAGNOSIS — D1800 Hemangioma unspecified site: Secondary | ICD-10-CM

## 2021-05-02 DIAGNOSIS — L570 Actinic keratosis: Secondary | ICD-10-CM | POA: Diagnosis not present

## 2021-05-02 DIAGNOSIS — L820 Inflamed seborrheic keratosis: Secondary | ICD-10-CM | POA: Diagnosis not present

## 2021-05-02 DIAGNOSIS — Q825 Congenital non-neoplastic nevus: Secondary | ICD-10-CM

## 2021-05-02 DIAGNOSIS — Z85828 Personal history of other malignant neoplasm of skin: Secondary | ICD-10-CM

## 2021-05-02 DIAGNOSIS — Z1283 Encounter for screening for malignant neoplasm of skin: Secondary | ICD-10-CM | POA: Diagnosis not present

## 2021-05-02 DIAGNOSIS — L814 Other melanin hyperpigmentation: Secondary | ICD-10-CM

## 2021-05-02 DIAGNOSIS — L821 Other seborrheic keratosis: Secondary | ICD-10-CM

## 2021-05-02 MED ORDER — KETOCONAZOLE 2 % EX CREA: 1.0000 "application " | TOPICAL_CREAM | Freq: Every day | CUTANEOUS | 11 refills | Status: AC

## 2021-05-02 NOTE — Progress Notes (Signed)
Follow-Up Visit   Subjective  Jerome Joseph is a 65 y.o. male who presents for the following: Annual Exam (History of BCC/SCC - TBSE today). The patient presents for Total-Body Skin Exam (TBSE) for skin cancer screening and mole check.  The following portions of the chart were reviewed this encounter and updated as appropriate:   Tobacco  Allergies  Meds  Problems  Med Hx  Surg Hx  Fam Hx     Review of Systems:  No other skin or systemic complaints except as noted in HPI or Assessment and Plan.  Objective  Well appearing patient in no apparent distress; mood and affect are within normal limits.  A full examination was performed including scalp, head, eyes, ears, nose, lips, neck, chest, axillae, abdomen, back, buttocks, bilateral upper extremities, bilateral lower extremities, hands, feet, fingers, toes, fingernails, and toenails. All findings within normal limits unless otherwise noted below.  Arms/hands x 14, face x 22 (36) Erythematous thin papules/macules with gritty scale.   Glabella Pinkness and scale   Left Hip (side) - Posterior regular brown plaque  Rough tan to brown plaques face, arms - Total x 5 (5)  Assessment & Plan   Lentigines - Scattered tan macules - Due to sun exposure - Benign-appearing, observe - Recommend daily broad spectrum sunscreen SPF 30+ to sun-exposed areas, reapply every 2 hours as needed. - Call for any changes  Seborrheic Keratoses - Stuck-on, waxy, tan-brown papules and/or plaques  - Benign-appearing - Discussed benign etiology and prognosis. - Observe - Call for any changes  Melanocytic Nevi - Tan-brown and/or pink-flesh-colored symmetric macules and papules - Benign appearing on exam today - Observation - Call clinic for new or changing moles - Recommend daily use of broad spectrum spf 30+ sunscreen to sun-exposed areas.   Hemangiomas - Red papules - Discussed benign nature - Observe - Call for any changes  Actinic  Damage - Chronic condition, secondary to cumulative UV/sun exposure - diffuse scaly erythematous macules with underlying dyspigmentation - Recommend daily broad spectrum sunscreen SPF 30+ to sun-exposed areas, reapply every 2 hours as needed.  - Staying in the shade or wearing long sleeves, sun glasses (UVA+UVB protection) and wide brim hats (4-inch brim around the entire circumference of the hat) are also recommended for sun protection.  - Call for new or changing lesions.  Skin cancer screening performed today.  AK (actinic keratosis) (36) Arms/hands x 14, face x 22  Actinic Damage - Severe, confluent actinic changes with pre-cancerous actinic keratoses  - Severe, chronic, not at goal, secondary to cumulative UV radiation exposure over time - diffuse scaly erythematous macules and papules with underlying dyspigmentation - Discussed Prescription "Field Treatment" for Severe, Chronic Confluent Actinic Changes with Pre-Cancerous Actinic Keratoses Field treatment involves treatment of an entire area of skin that has confluent Actinic Changes (Sun/ Ultraviolet light damage) and PreCancerous Actinic Keratoses by method of PhotoDynamic Therapy (PDT) and/or prescription Topical Chemotherapy agents such as 5-fluorouracil, 5-fluorouracil/calcipotriene, and/or imiquimod.  The purpose is to decrease the number of clinically evident and subclinical PreCancerous lesions to prevent progression to development of skin cancer by chemically destroying early precancer changes that may or may not be visible.  It has been shown to reduce the risk of developing skin cancer in the treated area. As a result of treatment, redness, scaling, crusting, and open sores may occur during treatment course. One or more than one of these methods may be used and may have to be used several times to  control, suppress and eliminate the PreCancerous changes. Discussed treatment course, expected reaction, and possible side effects. -  Recommend daily broad spectrum sunscreen SPF 30+ to sun-exposed areas, reapply every 2 hours as needed.  - Staying in the shade or wearing long sleeves, sun glasses (UVA+UVB protection) and wide brim hats (4-inch brim around the entire circumference of the hat) are also recommended. - Call for new or changing lesions.  Will plan PDT to arm and hands on follow up. May consider topical field treatment in the future.  Destruction of lesion - Arms/hands x 14, face x 22 Complexity: simple   Destruction method: cryotherapy   Informed consent: discussed and consent obtained   Timeout:  patient name, date of birth, surgical site, and procedure verified Lesion destroyed using liquid nitrogen: Yes   Region frozen until ice ball extended beyond lesion: Yes   Outcome: patient tolerated procedure well with no complications   Post-procedure details: wound care instructions given    Seborrheic dermatitis Glabella Start Ketoconazole 2% cream qd Monday, Wednesday, Friday ketoconazole (NIZORAL) 2 % cream - Glabella Apply 1 application topically daily. Monday, Wednesday, Friday  Inflamed seborrheic keratosis face, arms - Total x 5 (5);   Destruction of lesion - face, arms - Total x 5 Complexity: simple   Destruction method: cryotherapy   Informed consent: discussed and consent obtained   Timeout:  patient name, date of birth, surgical site, and procedure verified Lesion destroyed using liquid nitrogen: Yes   Region frozen until ice ball extended beyond lesion: Yes   Outcome: patient tolerated procedure well with no complications   Post-procedure details: wound care instructions given    Congenital Nevus L posterior hip Benign-appearing.  Observation.  Call clinic for new or changing lesions.  Recommend daily use of broad spectrum spf 30+ sunscreen to sun-exposed areas.   Skin cancer screening  Return in about 6 weeks (around 06/13/2021) for PDT to arms and hands, January with Dr. Nehemiah Massed.  I,  Ashok Cordia, CMA, am acting as scribe for Sarina Ser, MD . Documentation: I have reviewed the above documentation for accuracy and completeness, and I agree with the above.  Sarina Ser, MD

## 2021-05-02 NOTE — Patient Instructions (Signed)

## 2021-05-05 ENCOUNTER — Encounter: Payer: Self-pay | Admitting: Dermatology

## 2021-06-13 ENCOUNTER — Ambulatory Visit (INDEPENDENT_AMBULATORY_CARE_PROVIDER_SITE_OTHER): Payer: BC Managed Care – PPO

## 2021-06-13 ENCOUNTER — Other Ambulatory Visit: Payer: Self-pay

## 2021-06-13 DIAGNOSIS — L570 Actinic keratosis: Secondary | ICD-10-CM | POA: Diagnosis not present

## 2021-06-13 MED ORDER — AMINOLEVULINIC ACID HCL 20 % EX SOLR
2.0000 "application " | Freq: Once | CUTANEOUS | Status: AC
  Administered 2021-06-13: 708 mg via TOPICAL

## 2021-06-13 NOTE — Patient Instructions (Signed)

## 2021-06-13 NOTE — Progress Notes (Signed)
Patient completed PDT therapy today.  1. AK (actinic keratosis) (4) Left Forearm - Posterior; Right Forearm - Posterior; Left Dorsal Hand; Right Dorsal Hand  Photodynamic therapy - Left Dorsal Hand, Left Forearm - Posterior, Right Dorsal Hand, Right Forearm - Posterior Procedure discussed: discussed risks, benefits, side effects. and alternatives   Prep: site scrubbed/prepped with acetone   Location:  Hands and arms Number of lesions:  Multiple Type of treatment:  Blue light Aminolevulinic Acid (see MAR for details): Levulan Number of Levulan sticks used:  2 Incubation time (minutes):  120 Number of minutes under lamp:  16 Number of seconds under lamp:  40 Cooling:  Floor fan Outcome: patient tolerated procedure well with no complications   Post-procedure details: sunscreen applied    Aminolevulinic Acid HCl 20 % SOLR 708 mg - Left Dorsal Hand, Left Forearm - Posterior, Right Dorsal Hand, Right Forearm - Posterior

## 2021-09-05 ENCOUNTER — Ambulatory Visit: Payer: BC Managed Care – PPO | Admitting: Dermatology

## 2021-09-05 ENCOUNTER — Other Ambulatory Visit: Payer: Self-pay

## 2021-09-05 DIAGNOSIS — L820 Inflamed seborrheic keratosis: Secondary | ICD-10-CM

## 2021-09-05 DIAGNOSIS — L578 Other skin changes due to chronic exposure to nonionizing radiation: Secondary | ICD-10-CM | POA: Diagnosis not present

## 2021-09-05 DIAGNOSIS — L570 Actinic keratosis: Secondary | ICD-10-CM

## 2021-09-05 MED ORDER — FLUOROURACIL 5 % EX CREA: TOPICAL_CREAM | CUTANEOUS | 0 refills | Status: DC

## 2021-09-05 NOTE — Progress Notes (Signed)
Follow-Up Visit   Subjective  Jerome Joseph is a 66 y.o. male who presents for the following: Actinic Keratosis (3 month follow up - hands/arms and face treated with LN2, hands/arms treated with PDT 06/13/21). The patient has spots, moles and lesions to be evaluated, some may be new or changing and the patient has concerns that these could be cancer.  The following portions of the chart were reviewed this encounter and updated as appropriate:   Tobacco   Allergies   Meds   Problems   Med Hx   Surg Hx   Fam Hx      Review of Systems:  No other skin or systemic complaints except as noted in HPI or Assessment and Plan.  Objective  Well appearing patient in no apparent distress; mood and affect are within normal limits.  A focused examination was performed including face, arms, hands. Relevant physical exam findings are noted in the Assessment and Plan.  Face, arms, hands, neck (20) Erythematous thin papules/macules with gritty scale.   Arms (4) Erythematous stuck-on, waxy papule or plaque   Assessment & Plan  AK (actinic keratosis) (20) Face, arms, hands, neck  Actinic Damage - Severe, confluent actinic changes with pre-cancerous actinic keratoses  - Severe, chronic, not at goal, secondary to cumulative UV radiation exposure over time - diffuse scaly erythematous macules and papules with underlying dyspigmentation - Discussed Prescription "Field Treatment" for Severe, Chronic Confluent Actinic Changes with Pre-Cancerous Actinic Keratoses Field treatment involves treatment of an entire area of skin that has confluent Actinic Changes (Sun/ Ultraviolet light damage) and PreCancerous Actinic Keratoses by method of PhotoDynamic Therapy (PDT) and/or prescription Topical Chemotherapy agents such as 5-fluorouracil, 5-fluorouracil/calcipotriene, and/or imiquimod.  The purpose is to decrease the number of clinically evident and subclinical PreCancerous lesions to prevent progression to  development of skin cancer by chemically destroying early precancer changes that may or may not be visible.  It has been shown to reduce the risk of developing skin cancer in the treated area. As a result of treatment, redness, scaling, crusting, and open sores may occur during treatment course. One or more than one of these methods may be used and may have to be used several times to control, suppress and eliminate the PreCancerous changes. Discussed treatment course, expected reaction, and possible side effects. - Recommend daily broad spectrum sunscreen SPF 30+ to sun-exposed areas, reapply every 2 hours as needed.  - Staying in the shade or wearing long sleeves, sun glasses (UVA+UVB protection) and wide brim hats (4-inch brim around the entire circumference of the hat) are also recommended. - Call for new or changing lesions  Start Fluorouracil 5%/Calcipotriene cream bid x 1 week to forehead and temples. Wait 2-3 weeks then apply bid to hands/arms bid x 1 week  Destruction of lesion - Face, arms, hands, neck Complexity: simple   Destruction method: cryotherapy   Informed consent: discussed and consent obtained   Timeout:  patient name, date of birth, surgical site, and procedure verified Lesion destroyed using liquid nitrogen: Yes   Region frozen until ice ball extended beyond lesion: Yes   Outcome: patient tolerated procedure well with no complications   Post-procedure details: wound care instructions given    fluorouracil (EFUDEX) 5 % cream - Face, arms, hands, neck Apply topically as directed. Apply to face twice daily x 1 week. Wait 2-3 weeks then apply twice daily x 1 week to hands and arms.  Inflamed seborrheic keratosis (4) Arms  Destruction of lesion -  Arms Complexity: simple   Destruction method: cryotherapy   Informed consent: discussed and consent obtained   Timeout:  patient name, date of birth, surgical site, and procedure verified Lesion destroyed using liquid nitrogen:  Yes   Region frozen until ice ball extended beyond lesion: Yes   Outcome: patient tolerated procedure well with no complications   Post-procedure details: wound care instructions given     Return in about 6 months (around 03/05/2022) for AK follow up.  I, Ashok Cordia, CMA, am acting as scribe for Sarina Ser, MD . Documentation: I have reviewed the above documentation for accuracy and completeness, and I agree with the above.  Sarina Ser, MD

## 2021-09-05 NOTE — Patient Instructions (Addendum)
Cryotherapy Aftercare  Wash gently with soap and water everyday.   Apply Vaseline and Band-Aid daily until healed.   Start Fluorouracil 5%/Calcipotriene cream twice daily x 1 week to forehead and temples. Wait 2-3 weeks then apply to hands/arms twice daily x 1 week  If You Need Anything After Your Visit  If you have any questions or concerns for your doctor, please call our main line at [phone] and press option 4 to reach your doctor's medical assistant. If no one answers, please leave a voicemail as directed and we will return your call as soon as possible. Messages left after 4 pm will be answered the following business day.   You may also send [place] a message via [place]. We typically respond to MyChart messages within 1-2 business days.  For prescription refills, please ask your pharmacy to contact our office. Our fax number is [phone].  If you have an urgent issue when the clinic is closed that cannot wait until the next business day, you can page your doctor at the number below.    Please note that while we do our best to be available for urgent issues outside of office hours, we are not available 24/7.   If you have an urgent issue and are unable to reach [place], you may choose to seek medical care at your doctor's office, retail clinic, urgent care center, or emergency room.  If you have a medical emergency, please immediately call 911 or go to the emergency department.  Pager Numbers  - Dr. Nehemiah Massed: [phone]  - Dr. Laurence Ferrari: [phone]  - Dr. Nicole Kindred: [phone]  In the event of inclement weather, please call our main line at [phone] for an update on the status of any delays or closures.  Dermatology Medication Tips: Please keep the boxes that topical medications come in in order to help keep track of the instructions about where and how to use these. Pharmacies typically print the medication instructions only on the boxes and not directly on the medication  tubes.   If your medication is too expensive, please contact our office at [phone] option 4 or send [place] a message through [place].   We are unable to tell what your co-pay for medications will be in advance as this is different depending on your insurance coverage. However, we may be able to find a substitute medication at lower cost or fill out paperwork to get insurance to cover a needed medication.   If a prior authorization is required to get your medication covered by your insurance company, please allow [place] 1-2 business days to complete this process.  Drug prices often vary depending on where the prescription is filled and some pharmacies may offer cheaper prices.  The website www.goodrx.com contains coupons for medications through different pharmacies. The prices here do not account for what the cost may be with help from insurance (it may be cheaper with your insurance), but the website can give you the price if you did not use any insurance.  - You can print the associated coupon and take it with your prescription to the pharmacy.  - You may also stop by our office during regular business hours and pick up a GoodRx coupon card.  - If you need your prescription sent electronically to a different pharmacy, notify our office through [facility] or by phone at [phone] option 4.     Si Usted Necesita Algo Despus de Su Visita  Tambin puede enviarnos un mensaje a travs de  MyChart. Por lo general respondemos a los mensajes de MyChart en el transcurso de 1 a 2 das hbiles.  Para renovar recetas, por favor pida a su farmacia que se ponga en contacto con nuestra oficina. Harland Dingwall de fax es [place] [phone].  Si tiene un asunto urgente cuando la clnica est cerrada y que no puede esperar hasta el siguiente da hbil, puede llamar/localizar a su doctor(a) al nmero que aparece a continuacin.   Por favor, tenga en cuenta que aunque hacemos todo lo posible para estar  disponibles para asuntos urgentes fuera del horario de [place], no estamos disponibles las 24 horas del da, los 7 das de la [place].   Si tiene un problema urgente y no puede comunicarse con nosotros, puede optar por buscar atencin mdica  en el consultorio de su doctor(a), en una clnica privada, en un centro de atencin urgente o en una sala de emergencias.  Si tiene Engineering geologist, por favor llame inmediatamente al 911 o vaya a la sala de emergencias.  Nmeros de bper  - Dr. Nehemiah Massed: [phone]  - Dra. Moye: [phone]  - Dra. Nicole Kindred: [phone]  En caso de inclemencias del [place], por favor llame a Johnsie Kindred principal al [phone] para una actualizacin sobre el [place] de cualquier retraso o cierre.  Consejos para la medicacin en dermatologa: Por favor, guarde las cajas en las que vienen los medicamentos de uso tpico para ayudarle a seguir las instrucciones sobre dnde y cmo usarlos. Las farmacias generalmente imprimen las instrucciones del medicamento slo en las cajas y no directamente en los tubos del [place].   Si su medicamento es muy caro, por favor, pngase en contacto con Zigmund Daniel llamando al [phone] y presione la opcin 4 o envenos un mensaje a travs de Pharmacist, community.   No podemos decirle cul ser su copago por los medicamentos por adelantado ya que esto es diferente dependiendo de la cobertura de su seguro. Sin embargo, es posible que podamos encontrar un medicamento sustituto a Electrical engineer un formulario para que el seguro cubra el medicamento que se considera necesario.   Si se requiere una autorizacin previa para que su compaa de seguros [place] su medicamento, por favor permtanos de 1 a 2 das hbiles para completar este proceso.  Los precios de los medicamentos varan con frecuencia dependiendo del Environmental consultant de dnde se surte la receta y alguna farmacias pueden ofrecer precios ms baratos.  El sitio web www.goodrx.com tiene  cupones para medicamentos de Airline pilot. Los precios aqu no tienen en cuenta lo que podra costar con la ayuda del seguro (puede ser ms barato con su seguro), pero el sitio web puede darle el precio si no utiliz Research scientist (physical sciences).  - Puede imprimir el cupn correspondiente y llevarlo con su receta a la farmacia.  - Tambin puede pasar por nuestra oficina durante el horario de atencin regular y Charity fundraiser una tarjeta de cupones de GoodRx.  - Si necesita que su receta se enve electrnicamente a una farmacia diferente, informe a nuestra oficina a travs de MyChart de [place] o por telfono llamando al [phone] y presione la opcin 4.

## 2021-09-10 ENCOUNTER — Encounter: Payer: Self-pay | Admitting: Dermatology

## 2021-09-24 ENCOUNTER — Ambulatory Visit: Payer: Self-pay | Admitting: Medical

## 2021-09-24 ENCOUNTER — Other Ambulatory Visit: Payer: Self-pay

## 2021-09-24 ENCOUNTER — Encounter: Payer: Self-pay | Admitting: Medical

## 2021-09-24 VITALS — BP 140/86 | HR 103 | Temp 97.1°F | Resp 16

## 2021-09-24 DIAGNOSIS — J0100 Acute maxillary sinusitis, unspecified: Secondary | ICD-10-CM

## 2021-09-24 MED ORDER — AMOXICILLIN-POT CLAVULANATE 875-125 MG PO TABS: 1.0000 | ORAL_TABLET | Freq: Two times a day (BID) | ORAL | 0 refills | Status: DC

## 2021-09-25 NOTE — Progress Notes (Signed)
Patient to return in 1 week.  Subjective:    Patient ID: Jerome Joseph, male    DOB: [birth year], 66 y.o.   MRN: [id number]  HPI 66 yo male in non acute distress here due to sinus symptoms x 3 weeks. "It just is not getting any better". Denies fever or chills , SOB or CP.    Review of Systems  Constitutional:  Negative for chills and fever.  HENT:  Positive for congestion, rhinorrhea, sinus pressure and sinus pain.   Respiratory:  Negative for cough and shortness of breath.   Cardiovascular:  Negative for chest pain.  Neurological:  Negative for dizziness, syncope and headaches.      Objective:   Physical Exam Vitals and nursing note reviewed.  Constitutional:      Appearance: Normal appearance.  HENT:     Head: Normocephalic and atraumatic.     Right Ear: Ear canal and external ear normal.     Left Ear: Ear canal and external ear normal.     Nose: Congestion present.     Mouth/Throat:     Mouth: Mucous membranes are moist.     Pharynx: Oropharynx is clear.  Eyes:     Extraocular Movements: Extraocular movements intact.     Conjunctiva/sclera: Conjunctivae normal.     Pupils: Pupils are equal, round, and reactive to light.  Cardiovascular:     Rate and Rhythm: Normal rate and regular rhythm.     Heart sounds: Normal heart sounds.  Pulmonary:     Effort: Pulmonary effort is normal.     Breath sounds: Normal breath sounds.  Musculoskeletal:        General: Normal range of motion.     Cervical back: Normal range of motion and neck supple.  Skin:    General: Skin is warm and dry.  Neurological:     General: No focal deficit present.     Mental Status: He is alert. Mental status is at baseline.  Psychiatric:        Mood and Affect: Mood normal.        Behavior: Behavior normal.        Thought Content: Thought content normal.        Judgment: Judgment normal.          Assessment & Plan:  Sinusitis maxillary Meds ordered this encounter  Medications    amoxicillin-clavulanate (AUGMENTIN) 875-125 MG tablet    Sig: Take 1 tablet by mouth 2 (two) times daily.    Dispense:  20 tablet    Refill:  0   Return in one week for recheck. Patient verbalizes understanding and has no questions at discharge.

## 2021-09-30 ENCOUNTER — Ambulatory Visit: Payer: Self-pay | Admitting: Medical

## 2021-09-30 ENCOUNTER — Other Ambulatory Visit: Payer: Self-pay

## 2021-09-30 VITALS — BP 160/92 | HR 105 | Temp 97.2°F | Resp 16

## 2021-09-30 DIAGNOSIS — H6983 Other specified disorders of Eustachian tube, bilateral: Secondary | ICD-10-CM

## 2021-09-30 MED ORDER — PREDNISONE 10 MG (21) PO TBPK: ORAL_TABLET | ORAL | 0 refills | Status: DC

## 2021-09-30 NOTE — Progress Notes (Signed)
° °  Subjective:    Patient ID: Jerome Joseph, male    DOB: [birth year], 66 y.o.   MRN: [id number]  HPI 66 yo male in non acute distress. Here for recheck of ears after using Debrox for cerumen impaction on the right side. Still feels pressure in ear.  Other illness has  improved.Feeling much better today.  Blood pressure (!) 160/92, pulse (!) 105, temperature (!) 97.2 F (36.2 C), temperature source Tympanic, resp. rate 16, SpO2 96 %.  Allergies  Allergen Reactions   Oxycodone-Acetaminophen Itching     Review of Systems  Constitutional:  Negative for chills and fever.  HENT:  Positive for ear pain (pressure right).   Respiratory:  Negative for shortness of breath and wheezing.   Cardiovascular:  Negative for chest pain.      Objective:   Physical Exam Vitals and nursing note reviewed.  Constitutional:      Appearance: Normal appearance.  HENT:     Right Ear: Ear canal and external ear normal. A middle ear effusion is present.     Left Ear: Ear canal and external ear normal. A middle ear effusion is present.  Eyes:     Extraocular Movements: Extraocular movements intact.     Conjunctiva/sclera: Conjunctivae normal.     Pupils: Pupils are equal, round, and reactive to light.  Cardiovascular:     Rate and Rhythm: Normal rate and regular rhythm.     Heart sounds: Normal heart sounds. No murmur heard.   No friction rub. No gallop.  Pulmonary:     Effort: Pulmonary effort is normal.     Breath sounds: Normal breath sounds.  Neurological:     General: No focal deficit present.     Mental Status: He is alert and oriented to person, place, and time. Mental status is at baseline.  Psychiatric:        Mood and Affect: Mood normal.        Behavior: Behavior normal.        Thought Content: Thought content normal.        Judgment: Judgment normal.          Assessment & Plan:  Cerumen impaction on the right resolved. ETD bilaterally. Return to the clinic as  needed. Elevated blood pressure patient was anxious about getting ears irrigated and he also admits to white coat hypertension, he says he usually runs  130's /80's.

## 2021-11-11 ENCOUNTER — Other Ambulatory Visit: Payer: Self-pay | Admitting: Otolaryngology

## 2021-11-11 DIAGNOSIS — H9120 Sudden idiopathic hearing loss, unspecified ear: Secondary | ICD-10-CM

## 2021-12-05 ENCOUNTER — Ambulatory Visit
Admission: RE | Admit: 2021-12-05 | Discharge: 2021-12-05 | Disposition: A | Discharge status: Home / Self Care | Payer: BC Managed Care – PPO | Source: Ambulatory Visit | Attending: Otolaryngology | Admitting: Otolaryngology

## 2021-12-05 DIAGNOSIS — H9120 Sudden idiopathic hearing loss, unspecified ear: Secondary | ICD-10-CM

## 2021-12-05 MED ORDER — GADOBENATE DIMEGLUMINE 529 MG/ML IV SOLN
18.0000 mL | Freq: Once | INTRAVENOUS | Status: AC | PRN
  Administered 2021-12-05: 18 mL via INTRAVENOUS

## 2021-12-23 ENCOUNTER — Other Ambulatory Visit: Payer: Self-pay | Admitting: Dermatology

## 2021-12-23 DIAGNOSIS — L570 Actinic keratosis: Secondary | ICD-10-CM

## 2022-01-23 ENCOUNTER — Ambulatory Visit
Admission: RE | Admit: 2022-01-23 | Discharge: 2022-01-23 | Disposition: A | Discharge status: Home / Self Care | Payer: BC Managed Care – PPO | Attending: Adult Health | Admitting: Adult Health

## 2022-01-23 ENCOUNTER — Encounter: Payer: Self-pay | Admitting: Adult Health

## 2022-01-23 ENCOUNTER — Ambulatory Visit
Admission: RE | Admit: 2022-01-23 | Discharge: 2022-01-23 | Disposition: A | Discharge status: Home / Self Care | Payer: BC Managed Care – PPO | Source: Ambulatory Visit | Attending: Adult Health | Admitting: Adult Health

## 2022-01-23 ENCOUNTER — Ambulatory Visit: Payer: Self-pay | Admitting: Adult Health

## 2022-01-23 VITALS — BP 131/72 | HR 95 | Temp 98.4°F | Resp 16 | Ht 70.0 in | Wt 187.0 lb

## 2022-01-23 DIAGNOSIS — R058 Other specified cough: Secondary | ICD-10-CM

## 2022-01-23 MED ORDER — ALBUTEROL SULFATE HFA 108 (90 BASE) MCG/ACT IN AERS: 2.0000 | INHALATION_SPRAY | Freq: Four times a day (QID) | RESPIRATORY_TRACT | 0 refills | Status: DC | PRN

## 2022-01-23 MED ORDER — BENZONATATE 100 MG PO CAPS: 100.0000 mg | ORAL_CAPSULE | Freq: Three times a day (TID) | ORAL | 0 refills | Status: DC | PRN

## 2022-01-23 NOTE — Progress Notes (Signed)
[facility] [address], [place] [address]   Office Visit Note  Patient Name: Jerome Joseph  [id number]  [id number]  Date of Service: 01/23/2022  Chief Complaint  Patient presents with   Cold Exposure    Exposed to grandson who had bronchitis and ear infection month ago and now pt has productive tinged colored cough denies fever but has runny nose, as per patient has all these Symptoms from past month OTC --Claritin taken.      HPI Pt is here for a sick visit. Cough x 3-4 weeks. He was visiting his 9 month old grandson who was sick last month.  He and his wife soon became ill.  They improved, but his cough has lingered.  He reports coughing up some mucous early in the morning, and then coughs intermittently through out the day and night.   History of tonsilar cancer.    Current Medication:  Outpatient Encounter Medications as of 01/23/2022  Medication Sig   albuterol (VENTOLIN HFA) 108 (90 Base) MCG/ACT inhaler Inhale 2 puffs into the lungs every 6 (six) hours as needed for wheezing or shortness of breath (Cough).   amLODipine (NORVASC) 10 MG tablet Take 10 mg by mouth.   aspirin EC 81 MG tablet Take 81 mg by mouth 3 (three) times a week.    benzonatate (TESSALON) 100 MG capsule Take 1-2 capsules (100-200 mg total) by mouth 3 (three) times daily as needed for cough.   Cholecalciferol (VITAMIN D3) 1000 units CAPS Take 2,000 Units by mouth.    fluorouracil (EFUDEX) 5 % cream APPLY TOPICALLY AS DIRECTED. APPLY TO FACE TWICE DAILY FOR ONE WEEK. WAIT TWO (2) TO THREE (3) WEEKS THEN APPLY TWICE DAILY FOR ONE WEEK TO HANDS AND ARMS. Pharmacy to mix with Calcipotriene 0.005% cream.   levothyroxine (SYNTHROID, LEVOTHROID) 88 MCG tablet Take by mouth.   losartan-hydrochlorothiazide (HYZAAR) 100-12.5 MG tablet Take 1 tablet by mouth daily.   omeprazole (PRILOSEC) 40 MG capsule Take 40 mg by mouth as needed.   rosuvastatin (CRESTOR) 20 MG tablet Take by mouth.   sildenafil  (VIAGRA) 25 MG tablet Take by mouth.   amoxicillin-clavulanate (AUGMENTIN) 875-125 MG tablet Take 1 tablet by mouth 2 (two) times daily. (Patient not taking: Reported on 01/23/2022)   clorazepate (TRANXENE) 7.5 MG tablet Take 7.5 mg by mouth at bedtime as needed.  (Patient not taking: Reported on 01/23/2022)   ketoconazole (NIZORAL) 2 % cream Apply 1 application topically daily. Monday, Wednesday, Friday (Patient not taking: Reported on 01/23/2022)   predniSONE (STERAPRED UNI-PAK 21 TAB) 10 MG (21) TBPK tablet Take  6 tablets by mouth today , 5 tablets tomorrow then one less tablet every day thereafter. Take with food. (Patient not taking: Reported on 01/23/2022)   No facility-administered encounter medications on file as of 01/23/2022.      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  HENT:  Negative for congestion, postnasal drip, rhinorrhea, sinus pressure, sinus pain and trouble swallowing.   Respiratory:  Positive for cough. Negative for chest tightness, shortness of breath, wheezing and stridor.     Physical Exam Constitutional:      Appearance: Normal appearance. He is normal weight.  HENT:     Head: Normocephalic.     Nose: Nose normal.  Eyes:     Extraocular Movements: Extraocular movements intact.     Pupils: Pupils are equal, round, and reactive to light.   Cardiovascular:     Rate and Rhythm: Normal rate.  Pulmonary:     Effort: Pulmonary effort is normal. No respiratory distress.     Breath sounds: Normal breath sounds. No wheezing or rhonchi.  Chest:     Chest wall: No tenderness.  Musculoskeletal:     Cervical back: Normal range of motion.  Neurological:     Mental Status: He is alert.       Assessment/Plan: 1. Other cough Will get CXR due to persistent cough and cancer history.  Use benzonatate and Albuterol scheduled over the next 3 days, and then as needed.  Follow up if symptoms do not improve or new/worse symptoms develop.  - DG Chest 2 View; Future - benzonatate (TESSALON) 100 MG capsule; Take 1-2 capsules (100-200 mg total) by mouth 3 (three) times daily as needed for cough.  Dispense: 30 capsule; Refill: 0 - albuterol (VENTOLIN HFA) 108 (90 Base) MCG/ACT inhaler; Inhale 2 puffs into the lungs every 6 (six) hours as needed for wheezing or shortness of breath (Cough).  Dispense: 8 g; Refill: 0     General Counseling: [name] verbalizes understanding of the findings of todays visit and agrees with plan of treatment. I have discussed any further diagnostic evaluation that may be needed or ordered today. We also reviewed his medications today. he has been encouraged to call the office with any questions or concerns that should arise related to todays visit.   Orders Placed This Encounter  Procedures   DG Chest 2 View    Meds ordered this encounter  Medications   benzonatate (TESSALON) 100 MG capsule    Sig: Take 1-2 capsules (100-200 mg total) by mouth 3 (three) times daily as needed for cough.    Dispense:  30 capsule    Refill:  0   albuterol (VENTOLIN HFA) 108 (90 Base) MCG/ACT inhaler    Sig: Inhale 2 puffs into the lungs every 6 (six) hours as needed for wheezing or shortness of breath (Cough).    Dispense:  8 g    Refill:  0    Time spent:20 Minutes    Kendell Bane AGNP-C Nurse Practitioner

## 2022-02-19 ENCOUNTER — Other Ambulatory Visit: Payer: Self-pay | Admitting: Adult Health

## 2022-02-19 DIAGNOSIS — R058 Other specified cough: Secondary | ICD-10-CM

## 2022-03-04 ENCOUNTER — Other Ambulatory Visit (HOSPITAL_COMMUNITY): Payer: Self-pay

## 2022-03-11 ENCOUNTER — Ambulatory Visit: Payer: PPO | Admitting: Dermatology

## 2022-03-11 DIAGNOSIS — I872 Venous insufficiency (chronic) (peripheral): Secondary | ICD-10-CM | POA: Diagnosis not present

## 2022-03-11 DIAGNOSIS — L817 Pigmented purpuric dermatosis: Secondary | ICD-10-CM

## 2022-03-11 DIAGNOSIS — L570 Actinic keratosis: Secondary | ICD-10-CM

## 2022-03-11 DIAGNOSIS — Z85828 Personal history of other malignant neoplasm of skin: Secondary | ICD-10-CM

## 2022-03-11 DIAGNOSIS — L578 Other skin changes due to chronic exposure to nonionizing radiation: Secondary | ICD-10-CM | POA: Diagnosis not present

## 2022-03-11 DIAGNOSIS — L820 Inflamed seborrheic keratosis: Secondary | ICD-10-CM | POA: Diagnosis not present

## 2022-03-11 MED ORDER — FLUOROURACIL 5 % EX CREA: TOPICAL_CREAM | CUTANEOUS | 4 refills | Status: DC

## 2022-03-11 NOTE — Progress Notes (Signed)
Follow-Up Visit   Subjective  Jerome Joseph is a 66 y.o. male who presents for the following: Actinic Keratosis (6 month follow up - face, arms, hands treated with LN2 and 5FU/Calcipotriene cream) and Other (Discoloration of left lower leg - he had an injury to his lat lower leg and he still has some swelling and the discoloration popped up on his left medial lower leg).  The following portions of the chart were reviewed this encounter and updated as appropriate:   Tobacco  Allergies  Meds  Problems  Med Hx  Surg Hx  Fam Hx     Review of Systems:  No other skin or systemic complaints except as noted in HPI or Assessment and Plan.  Objective  Well appearing patient in no apparent distress; mood and affect are within normal limits.  A focused examination was performed including face, arms, hands, legs. Relevant physical exam findings are noted in the Assessment and Plan.  Left calf x 1, right arm x 3, left arm/hand x 3 (7) Erythematous stuck-on, waxy papule or plaque  Right arm /hand x 4, left arm/hand x 8, face x 16 (28) Hyperkeratotic papules   Assessment & Plan   History of Basal Cell Carcinoma of the Skin - No evidence of recurrence today - Recommend regular full body skin exams - Recommend daily broad spectrum sunscreen SPF 30+ to sun-exposed areas, reapply every 2 hours as needed.  - Call if any new or changing lesions are noted between office visits  History of Squamous Cell Carcinoma of the Skin - No evidence of recurrence today - No lymphadenopathy - Recommend regular full body skin exams - Recommend daily broad spectrum sunscreen SPF 30+ to sun-exposed areas, reapply every 2 hours as needed.  - Call if any new or changing lesions are noted between office visits  Inflamed seborrheic keratosis (7) Left calf x 1, right arm x 3, left arm/hand x 3 Destruction of lesion - Left calf x 1, right arm x 3, left arm/hand x 3 Complexity: simple   Destruction method:  cryotherapy   Informed consent: discussed and consent obtained   Timeout:  patient name, date of birth, surgical site, and procedure verified Lesion destroyed using liquid nitrogen: Yes   Region frozen until ice ball extended beyond lesion: Yes   Outcome: patient tolerated procedure well with no complications   Post-procedure details: wound care instructions given    Venous stasis dermatitis of left lower extremity Left Lower Leg - Anterior With Schamberg's Purpura  AK (actinic keratosis) x 28 Right arm /hand x 4, left arm/hand x 8, face x 16 Residual hypertrophic Aks after LN2 and Fluorouracil 5%/Calcipotriene cream treatment  Actinic Damage - Severe, confluent actinic changes with pre-cancerous actinic keratoses  - Severe, chronic, not at goal, secondary to cumulative UV radiation exposure over time - diffuse scaly erythematous macules and papules with underlying dyspigmentation - Discussed Prescription "Field Treatment" for Severe, Chronic Confluent Actinic Changes with Pre-Cancerous Actinic Keratoses Field treatment involves treatment of an entire area of skin that has confluent Actinic Changes (Sun/ Ultraviolet light damage) and PreCancerous Actinic Keratoses by method of PhotoDynamic Therapy (PDT) and/or prescription Topical Chemotherapy agents such as 5-fluorouracil, 5-fluorouracil/calcipotriene, and/or imiquimod.  The purpose is to decrease the number of clinically evident and subclinical PreCancerous lesions to prevent progression to development of skin cancer by chemically destroying early precancer changes that may or may not be visible.  It has been shown to reduce the risk of developing skin  cancer in the treated area. As a result of treatment, redness, scaling, crusting, and open sores may occur during treatment course. One or more than one of these methods may be used and may have to be used several times to control, suppress and eliminate the PreCancerous changes. Discussed  treatment course, expected reaction, and possible side effects. - Recommend daily broad spectrum sunscreen SPF 30+ to sun-exposed areas, reapply every 2 hours as needed.  - Staying in the shade or wearing long sleeves, sun glasses (UVA+UVB protection) and wide brim hats (4-inch brim around the entire circumference of the hat) are also recommended. - Call for new or changing lesions.  Wait one month, then restart Fluorouracil 5%/Calcipotriene cream bid x 7 days to face and arms/hands  Destruction of lesion - Right arm /hand x 4, left arm/hand x 8, face x 16 Complexity: simple   Destruction method: cryotherapy   Informed consent: discussed and consent obtained   Timeout:  patient name, date of birth, surgical site, and procedure verified Lesion destroyed using liquid nitrogen: Yes   Region frozen until ice ball extended beyond lesion: Yes   Outcome: patient tolerated procedure well with no complications   Post-procedure details: wound care instructions given    Related Medications fluorouracil (EFUDEX) 5 % cream APPLY TOPICALLY AS DIRECTED. APPLY TO FACE TWICE DAILY FOR ONE WEEK. WAIT TWO (2) TO THREE (3) WEEKS THEN APPLY TWICE DAILY FOR ONE WEEK TO HANDS AND ARMS.  Return in about 6 months (around 09/11/2022) for AK follow up.  I, Ashok Cordia, CMA, am acting as scribe for Sarina Ser, MD . Documentation: I have reviewed the above documentation for accuracy and completeness, and I agree with the above.  Sarina Ser, MD

## 2022-03-11 NOTE — Patient Instructions (Signed)
Cryotherapy Aftercare  Wash gently with soap and water everyday.   Apply Vaseline and Band-Aid daily until healed.     Due to recent changes in healthcare laws, you may see results of your pathology and/or laboratory studies on MyChart before the doctors have had a chance to review them. We understand that in some cases there may be results that are confusing or concerning to you. Please understand that not all results are received at the same time and often the doctors may need to interpret multiple results in order to provide you with the best plan of care or course of treatment. Therefore, we ask that you please give us 2 business days to thoroughly review all your results before contacting the office for clarification. Should we see a critical lab result, you will be contacted sooner.   If You Need Anything After Your Visit  If you have any questions or concerns for your doctor, please call our main line at [phone] and press option 4 to reach your doctor's medical assistant. If no one answers, please leave a voicemail as directed and we will return your call as soon as possible. Messages left after 4 pm will be answered the following business day.   You may also send us a message via MyChart. We typically respond to MyChart messages within 1-2 business days.  For prescription refills, please ask your pharmacy to contact our office. Our fax number is [phone].  If you have an urgent issue when the clinic is closed that cannot wait until the next business day, you can page your doctor at the number below.    Please note that while we do our best to be available for urgent issues outside of office hours, we are not available 24/7.   If you have an urgent issue and are unable to reach us, you may choose to seek medical care at your doctor's office, retail clinic, urgent care center, or emergency room.  If you have a medical emergency, please immediately call 911 or go to the  emergency department.  Pager Numbers  - Dr. Kowalski: [phone]  - Dr. Moye: [phone]  - Dr. Stewart: [phone]  In the event of inclement weather, please call our main line at [phone] for an update on the status of any delays or closures.  Dermatology Medication Tips: Please keep the boxes that topical medications come in in order to help keep track of the instructions about where and how to use these. Pharmacies typically print the medication instructions only on the boxes and not directly on the medication tubes.   If your medication is too expensive, please contact our office at [phone] option 4 or send us a message through MyChart.   We are unable to tell what your co-pay for medications will be in advance as this is different depending on your insurance coverage. However, we may be able to find a substitute medication at lower cost or fill out paperwork to get insurance to cover a needed medication.   If a prior authorization is required to get your medication covered by your insurance company, please allow us 1-2 business days to complete this process.  Drug prices often vary depending on where the prescription is filled and some pharmacies may offer cheaper prices.  The website www.goodrx.com contains coupons for medications through different pharmacies. The prices here do not account for what the cost may be with help from insurance (it may be cheaper with your insurance), but the website can   give you the price if you did not use any insurance.  - You can print the associated coupon and take it with your prescription to the pharmacy.  - You may also stop by our office during regular business hours and pick up a GoodRx coupon card.  - If you need your prescription sent electronically to a different pharmacy, notify our office through [facility] MyChart or by phone at [phone] option 4.     Si Usted Necesita Algo Despus de Su Visita  Tambin puede  enviarnos un mensaje a travs de MyChart. Por lo general respondemos a los mensajes de MyChart en el transcurso de 1 a 2 das hbiles.  Para renovar recetas, por favor pida a su farmacia que se ponga en contacto con nuestra oficina. Nuestro nmero de fax es el [phone].  Si tiene un asunto urgente cuando la clnica est cerrada y que no puede esperar hasta el siguiente da hbil, puede llamar/localizar a su doctor(a) al nmero que aparece a continuacin.   Por favor, tenga en cuenta que aunque hacemos todo lo posible para estar disponibles para asuntos urgentes fuera del horario de oficina, no estamos disponibles las 24 horas del da, los 7 das de la semana.   Si tiene un problema urgente y no puede comunicarse con nosotros, puede optar por buscar atencin mdica  en el consultorio de su doctor(a), en una clnica privada, en un centro de atencin urgente o en una sala de emergencias.  Si tiene una emergencia mdica, por favor llame inmediatamente al 911 o vaya a la sala de emergencias.  Nmeros de bper  - Dr. Kowalski: [phone]  - Dra. Moye: [phone]  - Dra. Stewart: [phone]  En caso de inclemencias del tiempo, por favor llame a nuestra lnea principal al [phone] para una actualizacin sobre el estado de cualquier retraso o cierre.  Consejos para la medicacin en dermatologa: Por favor, guarde las cajas en las que vienen los medicamentos de uso tpico para ayudarle a seguir las instrucciones sobre dnde y cmo usarlos. Las farmacias generalmente imprimen las instrucciones del medicamento slo en las cajas y no directamente en los tubos del medicamento.   Si su medicamento es muy caro, por favor, pngase en contacto con nuestra oficina llamando al [phone] y presione la opcin 4 o envenos un mensaje a travs de MyChart.   No podemos decirle cul ser su copago por los medicamentos por adelantado ya que esto es diferente dependiendo de la cobertura de su seguro.  Sin embargo, es posible que podamos encontrar un medicamento sustituto a menor costo o llenar un formulario para que el seguro cubra el medicamento que se considera necesario.   Si se requiere una autorizacin previa para que su compaa de seguros cubra su medicamento, por favor permtanos de 1 a 2 das hbiles para completar este proceso.  Los precios de los medicamentos varan con frecuencia dependiendo del lugar de dnde se surte la receta y alguna farmacias pueden ofrecer precios ms baratos.  El sitio web www.goodrx.com tiene cupones para medicamentos de diferentes farmacias. Los precios aqu no tienen en cuenta lo que podra costar con la ayuda del seguro (puede ser ms barato con su seguro), pero el sitio web puede darle el precio si no utiliz ningn seguro.  - Puede imprimir el cupn correspondiente y llevarlo con su receta a la farmacia.  - Tambin puede pasar por nuestra oficina durante el horario de atencin regular y recoger una tarjeta de cupones de GoodRx.  -   Si necesita que su receta se enve electrnicamente a una farmacia diferente, informe a nuestra oficina a travs de MyChart de [facility] o por telfono llamando al [phone] y presione la opcin 4.  

## 2022-03-16 ENCOUNTER — Encounter: Payer: Self-pay | Admitting: Dermatology

## 2022-03-17 ENCOUNTER — Other Ambulatory Visit (HOSPITAL_COMMUNITY): Payer: Self-pay

## 2022-03-17 MED ORDER — LEVOTHYROXINE SODIUM 88 MCG PO TABS
ORAL_TABLET | ORAL | 1 refills | Status: DC
  Filled 2022-03-17: qty 90, 90d supply, fill #0
  Filled 2022-06-09: qty 90, 90d supply, fill #1

## 2022-03-17 MED ORDER — CLORAZEPATE DIPOTASSIUM 7.5 MG PO TABS: ORAL_TABLET | ORAL | 1 refills | Status: DC

## 2022-03-17 MED ORDER — LOSARTAN POTASSIUM-HCTZ 100-12.5 MG PO TABS
1.0000 | ORAL_TABLET | Freq: Every day | ORAL | 1 refills | Status: DC
  Filled 2022-03-17: qty 90, 90d supply, fill #0
  Filled 2022-06-09: qty 90, 90d supply, fill #1

## 2022-03-17 MED ORDER — AMLODIPINE BESYLATE 10 MG PO TABS
ORAL_TABLET | ORAL | 1 refills | Status: DC
  Filled 2022-03-17: qty 90, 90d supply, fill #0
  Filled 2022-06-09: qty 90, 90d supply, fill #1

## 2022-03-26 ENCOUNTER — Other Ambulatory Visit (HOSPITAL_COMMUNITY): Payer: Self-pay

## 2022-04-24 ENCOUNTER — Other Ambulatory Visit: Payer: PPO

## 2022-04-24 DIAGNOSIS — R972 Elevated prostate specific antigen [PSA]: Secondary | ICD-10-CM | POA: Diagnosis not present

## 2022-04-25 LAB — PSA TOTAL (REFLEX TO FREE): Prostate Specific Ag, Serum: 7.6 ng/mL — ABNORMAL HIGH (ref 0.0–4.0)

## 2022-04-25 LAB — FPSA% REFLEX
% FREE PSA: 15 %
PSA, FREE: 1.14 ng/mL

## 2022-04-30 ENCOUNTER — Ambulatory Visit: Payer: PPO | Admitting: Urology

## 2022-05-01 ENCOUNTER — Encounter: Payer: Self-pay | Admitting: Urology

## 2022-05-01 ENCOUNTER — Ambulatory Visit: Payer: PPO | Admitting: Urology

## 2022-05-13 ENCOUNTER — Encounter: Payer: Self-pay | Admitting: Urology

## 2022-05-13 ENCOUNTER — Ambulatory Visit: Payer: PPO | Admitting: Urology

## 2022-05-13 VITALS — BP 159/85 | HR 101 | Ht 70.0 in | Wt 185.0 lb

## 2022-05-13 DIAGNOSIS — N3281 Overactive bladder: Secondary | ICD-10-CM

## 2022-05-13 DIAGNOSIS — R972 Elevated prostate specific antigen [PSA]: Secondary | ICD-10-CM | POA: Diagnosis not present

## 2022-05-13 NOTE — Progress Notes (Signed)
   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 Briefly, very healthy male who underwent a prostate biopsy November 2020 for an elevated PSA of 7.5 which showed a 50 g prostate with only benign prostate tissue and some chronic inflammation.  He then developed gross hematuria in April 2021  that was evaluated with CT urogram that was normal and a cystoscopy that showed some friable prostate tissue but was otherwise normal.  Cytology was also negative.  A repeat PSA in June 2021 had increased slightly to 8.7(10% free) and we opted for a prostate MRI for further evaluation.  Prostate MRI was performed on 02/28/2020 and showed no suspicious lesions.  Prostate volume was 67 g equating to a PSA density of 0.11.  Most recent PSA was 7.6(15% free) which is down from 9.2 last year, and 8.7 previously, and stable from 7.5 in October 2020.  He denies any changes over the last year.  He has minimal to no urinary symptoms, and only urinary complaint is occasional urgency.  This seems to be exacerbated by caffeine or running water.  He denies any episodes of gross hematuria over the last 9+ months.  We reviewed the risks and benefits of PSA screening, and the controversies surrounding PSA.  With his extensive history of mildly elevated PSA and persistent negative work-up, I recommended continuing PSA on a yearly basis.  We reviewed reassuring findings of stable PSA, low PSA density, negative prostate MRI, negative prostate biopsy.  -RTC 1 year for PSA/DRE -Return precautions discussed at length, consider finasteride in the future if worsening urinary symptoms or recurrent gross hematuria, or OAB medication if worsening urgency   Billey Co, MD  [facility] [address] [place], [place] [address] [phone]

## 2022-05-13 NOTE — Patient Instructions (Signed)

## 2022-05-15 ENCOUNTER — Other Ambulatory Visit (HOSPITAL_COMMUNITY): Payer: Self-pay

## 2022-05-23 ENCOUNTER — Other Ambulatory Visit (HOSPITAL_COMMUNITY): Payer: Self-pay

## 2022-05-24 DIAGNOSIS — U071 COVID-19: Secondary | ICD-10-CM | POA: Diagnosis not present

## 2022-06-09 ENCOUNTER — Other Ambulatory Visit (HOSPITAL_COMMUNITY): Payer: Self-pay

## 2022-07-07 ENCOUNTER — Other Ambulatory Visit (HOSPITAL_COMMUNITY): Payer: Self-pay

## 2022-08-19 DIAGNOSIS — Z0000 Encounter for general adult medical examination without abnormal findings: Secondary | ICD-10-CM | POA: Diagnosis not present

## 2022-08-19 DIAGNOSIS — G8929 Other chronic pain: Secondary | ICD-10-CM | POA: Diagnosis not present

## 2022-08-19 DIAGNOSIS — M25561 Pain in right knee: Secondary | ICD-10-CM | POA: Diagnosis not present

## 2022-08-19 DIAGNOSIS — E039 Hypothyroidism, unspecified: Secondary | ICD-10-CM | POA: Diagnosis not present

## 2022-08-19 DIAGNOSIS — I10 Essential (primary) hypertension: Secondary | ICD-10-CM | POA: Diagnosis not present

## 2022-08-19 DIAGNOSIS — E7800 Pure hypercholesterolemia, unspecified: Secondary | ICD-10-CM | POA: Diagnosis not present

## 2022-08-21 DIAGNOSIS — I10 Essential (primary) hypertension: Secondary | ICD-10-CM | POA: Diagnosis not present

## 2022-08-21 DIAGNOSIS — E7800 Pure hypercholesterolemia, unspecified: Secondary | ICD-10-CM | POA: Diagnosis not present

## 2022-08-21 DIAGNOSIS — E039 Hypothyroidism, unspecified: Secondary | ICD-10-CM | POA: Diagnosis not present

## 2022-09-08 ENCOUNTER — Other Ambulatory Visit (HOSPITAL_COMMUNITY): Payer: Self-pay

## 2022-09-09 ENCOUNTER — Other Ambulatory Visit: Payer: Self-pay

## 2022-09-09 ENCOUNTER — Other Ambulatory Visit (HOSPITAL_COMMUNITY): Payer: Self-pay

## 2022-09-09 MED ORDER — ROSUVASTATIN CALCIUM 20 MG PO TABS
20.0000 mg | ORAL_TABLET | Freq: Every day | ORAL | 3 refills | Status: AC
  Filled 2022-09-09: qty 90, 90d supply, fill #0
  Filled 2022-11-26: qty 90, 90d supply, fill #1

## 2022-09-09 MED ORDER — LOSARTAN POTASSIUM-HCTZ 100-12.5 MG PO TABS
1.0000 | ORAL_TABLET | Freq: Every day | ORAL | 1 refills | Status: DC
  Filled 2022-09-09: qty 90, 90d supply, fill #0
  Filled 2022-11-26: qty 90, 90d supply, fill #1

## 2022-09-09 MED ORDER — LOSARTAN POTASSIUM-HCTZ 100-12.5 MG PO TABS
1.0000 | ORAL_TABLET | Freq: Every day | ORAL | 1 refills | Status: AC
  Filled 2022-09-09 – 2023-03-24 (×2): qty 90, 90d supply, fill #0
  Filled 2023-06-19: qty 90, 90d supply, fill #1

## 2022-09-09 MED ORDER — AMLODIPINE BESYLATE 10 MG PO TABS
10.0000 mg | ORAL_TABLET | Freq: Every day | ORAL | 1 refills | Status: DC
  Filled 2022-09-09: qty 90, 90d supply, fill #0
  Filled 2022-11-26: qty 90, 90d supply, fill #1

## 2022-09-09 MED ORDER — AMLODIPINE BESYLATE 10 MG PO TABS
10.0000 mg | ORAL_TABLET | Freq: Every day | ORAL | 1 refills | Status: DC
  Filled 2022-09-09 – 2023-03-07 (×2): qty 90, 90d supply, fill #0

## 2022-09-09 MED ORDER — LEVOTHYROXINE SODIUM 88 MCG PO TABS
88.0000 ug | ORAL_TABLET | Freq: Every morning | ORAL | 1 refills | Status: DC
  Filled 2022-09-09: qty 90, 90d supply, fill #0
  Filled 2022-11-26: qty 90, 90d supply, fill #1

## 2022-09-09 MED ORDER — SILDENAFIL CITRATE 25 MG PO TABS
50.0000 mg | ORAL_TABLET | Freq: Every day | ORAL | 3 refills | Status: AC | PRN
  Filled 2022-09-09: qty 90, 22d supply, fill #0

## 2022-09-09 MED ORDER — OMEPRAZOLE 40 MG PO CPDR
40.0000 mg | DELAYED_RELEASE_CAPSULE | Freq: Every day | ORAL | 3 refills | Status: AC
  Filled 2022-09-09: qty 90, 90d supply, fill #0
  Filled 2022-11-26: qty 90, 90d supply, fill #1

## 2022-09-09 MED ORDER — CLORAZEPATE DIPOTASSIUM 7.5 MG PO TABS
ORAL_TABLET | ORAL | 1 refills | Status: AC
  Filled 2022-09-09: qty 90, 90d supply, fill #0

## 2022-09-09 MED ORDER — LEVOTHYROXINE SODIUM 88 MCG PO TABS
ORAL_TABLET | ORAL | 1 refills | Status: AC
  Filled 2022-09-09 – 2023-03-07 (×2): qty 90, 90d supply, fill #0
  Filled 2023-06-19: qty 90, 90d supply, fill #1

## 2022-09-10 ENCOUNTER — Other Ambulatory Visit: Payer: Self-pay

## 2022-09-10 ENCOUNTER — Other Ambulatory Visit (HOSPITAL_COMMUNITY): Payer: Self-pay

## 2022-09-16 ENCOUNTER — Ambulatory Visit: Payer: PPO | Admitting: Dermatology

## 2022-09-16 VITALS — BP 137/84

## 2022-09-16 DIAGNOSIS — L820 Inflamed seborrheic keratosis: Secondary | ICD-10-CM | POA: Diagnosis not present

## 2022-09-16 DIAGNOSIS — L570 Actinic keratosis: Secondary | ICD-10-CM | POA: Diagnosis not present

## 2022-09-16 DIAGNOSIS — L578 Other skin changes due to chronic exposure to nonionizing radiation: Secondary | ICD-10-CM

## 2022-09-16 DIAGNOSIS — L821 Other seborrheic keratosis: Secondary | ICD-10-CM | POA: Diagnosis not present

## 2022-09-16 DIAGNOSIS — D0462 Carcinoma in situ of skin of left upper limb, including shoulder: Secondary | ICD-10-CM

## 2022-09-16 DIAGNOSIS — Z79899 Other long term (current) drug therapy: Secondary | ICD-10-CM

## 2022-09-16 DIAGNOSIS — L300 Nummular dermatitis: Secondary | ICD-10-CM

## 2022-09-16 DIAGNOSIS — D485 Neoplasm of uncertain behavior of skin: Secondary | ICD-10-CM

## 2022-09-16 MED ORDER — MOMETASONE FUROATE 0.1 % EX CREA: 1.0000 | TOPICAL_CREAM | CUTANEOUS | 1 refills | Status: AC

## 2022-09-16 NOTE — Patient Instructions (Signed)
Cryotherapy Aftercare  Wash gently with soap and water everyday.   Apply Vaseline and Band-Aid daily until healed.  Wound Care Instructions  Cleanse wound gently with soap and water once a day then pat dry with clean gauze. Apply a thin coat of Petrolatum (petroleum jelly, "Vaseline") over the wound (unless you have an allergy to this). We recommend that you use a new, sterile tube of Vaseline. Do not pick or remove scabs. Do not remove the yellow or white "healing tissue" from the base of the wound.  Cover the wound with fresh, clean, nonstick gauze and secure with paper tape. You may use Band-Aids in place of gauze and tape if the wound is small enough, but would recommend trimming much of the tape off as there is often too much. Sometimes Band-Aids can irritate the skin.  You should call the office for your biopsy report after 1 week if you have not already been contacted.  If you experience any problems, such as abnormal amounts of bleeding, swelling, significant bruising, significant pain, or evidence of infection, please call the office immediately.  FOR ADULT SURGERY PATIENTS: If you need something for pain relief you may take 1 extra strength Tylenol (acetaminophen) AND 2 Ibuprofen (200mg each) together every 4 hours as needed for pain. (do not take these if you are allergic to them or if you have a reason you should not take them.) Typically, you may only need pain medication for 1 to 3 days.      Due to recent changes in healthcare laws, you may see results of your pathology and/or laboratory studies on MyChart before the doctors have had a chance to review them. We understand that in some cases there may be results that are confusing or concerning to you. Please understand that not all results are received at the same time and often the doctors may need to interpret multiple results in order to provide you with the best plan of care or course of treatment. Therefore, we ask that you  please give us 2 business days to thoroughly review all your results before contacting the office for clarification. Should we see a critical lab result, you will be contacted sooner.   If You Need Anything After Your Visit  If you have any questions or concerns for your doctor, please call our main line at [phone] and press option 4 to reach your doctor's medical assistant. If no one answers, please leave a voicemail as directed and we will return your call as soon as possible. Messages left after 4 pm will be answered the following business day.   You may also send us a message via MyChart. We typically respond to MyChart messages within 1-2 business days.  For prescription refills, please ask your pharmacy to contact our office. Our fax number is [phone].  If you have an urgent issue when the clinic is closed that cannot wait until the next business day, you can page your doctor at the number below.    Please note that while we do our best to be available for urgent issues outside of office hours, we are not available 24/7.   If you have an urgent issue and are unable to reach us, you may choose to seek medical care at your doctor's office, retail clinic, urgent care center, or emergency room.  If you have a medical emergency, please immediately call 911 or go to the emergency department.  Pager Numbers  - Dr. Kowalski: [phone]  -   Dr. Moye: [phone]  - Dr. Stewart: [phone]  In the event of inclement weather, please call our main line at [phone] for an update on the status of any delays or closures.  Dermatology Medication Tips: Please keep the boxes that topical medications come in in order to help keep track of the instructions about where and how to use these. Pharmacies typically print the medication instructions only on the boxes and not directly on the medication tubes.   If your medication is too expensive, please contact our office at  [phone] option 4 or send us a message through MyChart.   We are unable to tell what your co-pay for medications will be in advance as this is different depending on your insurance coverage. However, we may be able to find a substitute medication at lower cost or fill out paperwork to get insurance to cover a needed medication.   If a prior authorization is required to get your medication covered by your insurance company, please allow us 1-2 business days to complete this process.  Drug prices often vary depending on where the prescription is filled and some pharmacies may offer cheaper prices.  The website www.goodrx.com contains coupons for medications through different pharmacies. The prices here do not account for what the cost may be with help from insurance (it may be cheaper with your insurance), but the website can give you the price if you did not use any insurance.  - You can print the associated coupon and take it with your prescription to the pharmacy.  - You may also stop by our office during regular business hours and pick up a GoodRx coupon card.  - If you need your prescription sent electronically to a different pharmacy, notify our office through [facility] MyChart or by phone at [phone] option 4.     Si Usted Necesita Algo Despus de Su Visita  Tambin puede enviarnos un mensaje a travs de MyChart. Por lo general respondemos a los mensajes de MyChart en el transcurso de 1 a 2 das hbiles.  Para renovar recetas, por favor pida a su farmacia que se ponga en contacto con nuestra oficina. Nuestro nmero de fax es el [phone].  Si tiene un asunto urgente cuando la clnica est cerrada y que no puede esperar hasta el siguiente da hbil, puede llamar/localizar a su doctor(a) al nmero que aparece a continuacin.   Por favor, tenga en cuenta que aunque hacemos todo lo posible para estar disponibles para asuntos urgentes fuera del horario de oficina, no estamos  disponibles las 24 horas del da, los 7 das de la semana.   Si tiene un problema urgente y no puede comunicarse con nosotros, puede optar por buscar atencin mdica  en el consultorio de su doctor(a), en una clnica privada, en un centro de atencin urgente o en una sala de emergencias.  Si tiene una emergencia mdica, por favor llame inmediatamente al 911 o vaya a la sala de emergencias.  Nmeros de bper  - Dr. Kowalski: [phone]  - Dra. Moye: [phone]  - Dra. Stewart: [phone]  En caso de inclemencias del tiempo, por favor llame a nuestra lnea principal al [phone] para una actualizacin sobre el estado de cualquier retraso o cierre.  Consejos para la medicacin en dermatologa: Por favor, guarde las cajas en las que vienen los medicamentos de uso tpico para ayudarle a seguir las instrucciones sobre dnde y cmo usarlos. Las farmacias generalmente imprimen las instrucciones del medicamento slo en las cajas y   no directamente en los tubos del medicamento.   Si su medicamento es muy caro, por favor, pngase en contacto con nuestra oficina llamando al [phone] y presione la opcin 4 o envenos un mensaje a travs de MyChart.   No podemos decirle cul ser su copago por los medicamentos por adelantado ya que esto es diferente dependiendo de la cobertura de su seguro. Sin embargo, es posible que podamos encontrar un medicamento sustituto a menor costo o llenar un formulario para que el seguro cubra el medicamento que se considera necesario.   Si se requiere una autorizacin previa para que su compaa de seguros cubra su medicamento, por favor permtanos de 1 a 2 das hbiles para completar este proceso.  Los precios de los medicamentos varan con frecuencia dependiendo del lugar de dnde se surte la receta y alguna farmacias pueden ofrecer precios ms baratos.  El sitio web www.goodrx.com tiene cupones para medicamentos de diferentes farmacias. Los precios aqu no  tienen en cuenta lo que podra costar con la ayuda del seguro (puede ser ms barato con su seguro), pero el sitio web puede darle el precio si no utiliz ningn seguro.  - Puede imprimir el cupn correspondiente y llevarlo con su receta a la farmacia.  - Tambin puede pasar por nuestra oficina durante el horario de atencin regular y recoger una tarjeta de cupones de GoodRx.  - Si necesita que su receta se enve electrnicamente a una farmacia diferente, informe a nuestra oficina a travs de MyChart de [facility] o por telfono llamando al [phone] y presione la opcin 4.  

## 2022-09-16 NOTE — Progress Notes (Unsigned)
Follow-Up Visit   Subjective  Jerome Joseph is a 67 y.o. male who presents for the following: Actinic Keratosis (6 month follow up - face, arms/hands treated with LN2 and 5FU/Calcipotriene cream). The patient has spots, moles and lesions to be evaluated, some may be new or changing and the patient has concerns that these could be cancer.  The following portions of the chart were reviewed this encounter and updated as appropriate:   Tobacco  Allergies  Meds  Problems  Med Hx  Surg Hx  Fam Hx     Review of Systems:  No other skin or systemic complaints except as noted in HPI or Assessment and Plan.  Objective  Well appearing patient in no apparent distress; mood and affect are within normal limits.  A focused examination was performed including face, hands, arms, right leg. Relevant physical exam findings are noted in the Assessment and Plan.  Left pretibial Scaly pink patch  Left Dorsal Hand 1.0 cm pink papule  Face, hands (17) Erythematous thin papules/macules with gritty scale.   Right cheek x 1, right hand x 1, right prox post lat thigh x 1 (3) Erythematous stuck-on, waxy papule or plaque   Assessment & Plan   Actinic Damage - chronic, secondary to cumulative UV radiation exposure/sun exposure over time - diffuse scaly erythematous macules with underlying dyspigmentation - Recommend daily broad spectrum sunscreen SPF 30+ to sun-exposed areas, reapply every 2 hours as needed.  - Recommend staying in the shade or wearing long sleeves, sun glasses (UVA+UVB protection) and wide brim hats (4-inch brim around the entire circumference of the hat). - Call for new or changing lesions.  Seborrheic Keratoses - Stuck-on, waxy, tan-brown papules and/or plaques  - Benign-appearing - Discussed benign etiology and prognosis. - Observe - Call for any changes  Nummular dermatitis Left pretibial Start Mometasone cream qd-bid up to 5 days per week until clear. Recommend  Cerave cream daily Atopic dermatitis (eczema) is a chronic, relapsing, pruritic condition that can significantly affect quality of life. It is often associated with allergic rhinitis and/or asthma and can require treatment with topical medications, phototherapy, or in severe cases biologic injectable medication (Dupixent; Adbry) or Oral JAK inhibitors.  mometasone (ELOCON) 0.1 % cream - Left pretibial Apply 1 Application topically as directed. qd-bid up to 5 days per week until clear  Neoplasm of uncertain behavior of skin Left Dorsal Hand Epidermal / dermal shaving  Informed consent: discussed and consent obtained   Timeout: patient name, date of birth, surgical site, and procedure verified   Procedure prep:  Patient was prepped and draped in usual sterile fashion Prep type:  Isopropyl alcohol Anesthesia: the lesion was anesthetized in a standard fashion   Anesthetic:  1% lidocaine w/ epinephrine 1-100,000 buffered w/ 8.4% NaHCO3 Instrument used: flexible razor blade   Hemostasis achieved with: pressure, aluminum chloride and electrodesiccation   Outcome: patient tolerated procedure well   Post-procedure details: sterile dressing applied and wound care instructions given   Dressing type: bandage and petrolatum    Destruction of lesion Complexity: extensive   Destruction method: electrodesiccation and curettage   Informed consent: discussed and consent obtained   Timeout:  patient name, date of birth, surgical site, and procedure verified Procedure prep:  Patient was prepped and draped in usual sterile fashion Prep type:  Isopropyl alcohol Anesthesia: the lesion was anesthetized in a standard fashion   Anesthetic:  1% lidocaine w/ epinephrine 1-100,000 buffered w/ 8.4% NaHCO3 Curettage performed in three different  directions: Yes   Electrodesiccation performed over the curetted area: Yes   Hemostasis achieved with:  pressure and aluminum chloride Outcome: patient tolerated procedure  well with no complications   Post-procedure details: sterile dressing applied and wound care instructions given   Dressing type: bandage and petrolatum    Specimen 1 - Surgical pathology Differential Diagnosis: SCC vs other  Check Margins: No EDC today  AK (actinic keratosis) (17) Face, hands Destruction of lesion - Face, hands Complexity: simple   Destruction method: cryotherapy   Informed consent: discussed and consent obtained   Timeout:  patient name, date of birth, surgical site, and procedure verified Lesion destroyed using liquid nitrogen: Yes   Region frozen until ice ball extended beyond lesion: Yes   Outcome: patient tolerated procedure well with no complications   Post-procedure details: wound care instructions given    Inflamed seborrheic keratosis (3) Right cheek x 1, right hand x 1, right prox post lat thigh x 1 Destruction of lesion - Right cheek x 1, right hand x 1, right prox post lat thigh x 1 Complexity: simple   Destruction method: cryotherapy   Informed consent: discussed and consent obtained   Timeout:  patient name, date of birth, surgical site, and procedure verified Lesion destroyed using liquid nitrogen: Yes   Region frozen until ice ball extended beyond lesion: Yes   Outcome: patient tolerated procedure well with no complications   Post-procedure details: wound care instructions given    Return in about 6 months (around 03/17/2023) for Follow up.  I, Ashok Cordia, CMA, am acting as scribe for Sarina Ser, MD . Documentation: I have reviewed the above documentation for accuracy and completeness, and I agree with the above.  Sarina Ser, MD

## 2022-09-19 ENCOUNTER — Encounter: Payer: Self-pay | Admitting: Dermatology

## 2022-09-24 ENCOUNTER — Telehealth: Payer: Self-pay

## 2022-09-24 NOTE — Telephone Encounter (Signed)
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

## 2022-09-24 NOTE — Telephone Encounter (Signed)
Left message on voicemail to return my call.  

## 2022-10-28 ENCOUNTER — Telehealth: Payer: Self-pay

## 2022-10-28 NOTE — Telephone Encounter (Signed)
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 

## 2022-10-28 NOTE — Telephone Encounter (Signed)
Patient informed of pathology results 

## 2022-11-26 ENCOUNTER — Other Ambulatory Visit: Payer: Self-pay

## 2022-11-26 ENCOUNTER — Other Ambulatory Visit (HOSPITAL_COMMUNITY): Payer: Self-pay

## 2022-12-02 ENCOUNTER — Other Ambulatory Visit: Payer: Self-pay

## 2022-12-02 ENCOUNTER — Other Ambulatory Visit (HOSPITAL_COMMUNITY): Payer: Self-pay

## 2022-12-02 DIAGNOSIS — I10 Essential (primary) hypertension: Secondary | ICD-10-CM | POA: Diagnosis not present

## 2022-12-02 DIAGNOSIS — J019 Acute sinusitis, unspecified: Secondary | ICD-10-CM | POA: Diagnosis not present

## 2022-12-02 MED ORDER — DOXYCYCLINE HYCLATE 100 MG PO CAPS
100.0000 mg | ORAL_CAPSULE | Freq: Two times a day (BID) | ORAL | 0 refills | Status: DC
  Filled 2022-12-02 (×2): qty 14, 7d supply, fill #0

## 2022-12-02 MED ORDER — PREDNISONE 20 MG PO TABS
20.0000 mg | ORAL_TABLET | Freq: Every day | ORAL | 0 refills | Status: DC
  Filled 2022-12-02 (×2): qty 7, 7d supply, fill #0

## 2022-12-03 ENCOUNTER — Other Ambulatory Visit (HOSPITAL_COMMUNITY): Payer: Self-pay

## 2023-01-01 ENCOUNTER — Other Ambulatory Visit (HOSPITAL_COMMUNITY): Payer: Self-pay

## 2023-01-01 DIAGNOSIS — I10 Essential (primary) hypertension: Secondary | ICD-10-CM | POA: Diagnosis not present

## 2023-01-01 MED ORDER — CARVEDILOL 6.25 MG PO TABS
6.2500 mg | ORAL_TABLET | Freq: Two times a day (BID) | ORAL | 3 refills | Status: DC
  Filled 2023-01-01: qty 180, 90d supply, fill #0
  Filled 2023-05-12: qty 180, 90d supply, fill #1
  Filled 2023-08-03: qty 180, 90d supply, fill #2
  Filled 2023-11-17: qty 180, 90d supply, fill #3

## 2023-02-02 DIAGNOSIS — R059 Cough, unspecified: Secondary | ICD-10-CM | POA: Diagnosis not present

## 2023-02-02 DIAGNOSIS — R0902 Hypoxemia: Secondary | ICD-10-CM | POA: Diagnosis not present

## 2023-02-02 DIAGNOSIS — J40 Bronchitis, not specified as acute or chronic: Secondary | ICD-10-CM | POA: Diagnosis not present

## 2023-02-02 DIAGNOSIS — I10 Essential (primary) hypertension: Secondary | ICD-10-CM | POA: Diagnosis not present

## 2023-03-07 ENCOUNTER — Other Ambulatory Visit (HOSPITAL_COMMUNITY): Payer: Self-pay

## 2023-03-08 ENCOUNTER — Other Ambulatory Visit (HOSPITAL_COMMUNITY): Payer: Self-pay

## 2023-03-09 ENCOUNTER — Other Ambulatory Visit (HOSPITAL_COMMUNITY): Payer: Self-pay

## 2023-03-17 DIAGNOSIS — J188 Other pneumonia, unspecified organism: Secondary | ICD-10-CM | POA: Diagnosis not present

## 2023-03-17 DIAGNOSIS — I10 Essential (primary) hypertension: Secondary | ICD-10-CM | POA: Diagnosis not present

## 2023-03-17 DIAGNOSIS — E7800 Pure hypercholesterolemia, unspecified: Secondary | ICD-10-CM | POA: Diagnosis not present

## 2023-03-17 DIAGNOSIS — J189 Pneumonia, unspecified organism: Secondary | ICD-10-CM | POA: Diagnosis not present

## 2023-03-17 DIAGNOSIS — E039 Hypothyroidism, unspecified: Secondary | ICD-10-CM | POA: Diagnosis not present

## 2023-03-24 ENCOUNTER — Other Ambulatory Visit (HOSPITAL_COMMUNITY): Payer: Self-pay

## 2023-04-28 ENCOUNTER — Ambulatory Visit: Payer: PPO | Admitting: Dermatology

## 2023-05-12 ENCOUNTER — Other Ambulatory Visit (HOSPITAL_COMMUNITY): Payer: Self-pay

## 2023-05-12 ENCOUNTER — Other Ambulatory Visit: Payer: PPO

## 2023-05-12 DIAGNOSIS — R972 Elevated prostate specific antigen [PSA]: Secondary | ICD-10-CM

## 2023-05-13 LAB — FPSA% REFLEX
% FREE PSA: 17.5 %
PSA, FREE: 0.98 ng/mL

## 2023-05-13 LAB — PSA TOTAL (REFLEX TO FREE): Prostate Specific Ag, Serum: 5.6 ng/mL — ABNORMAL HIGH (ref 0.0–4.0)

## 2023-05-14 ENCOUNTER — Ambulatory Visit: Payer: PPO | Admitting: Urology

## 2023-05-14 ENCOUNTER — Encounter: Payer: Self-pay | Admitting: Urology

## 2023-05-14 VITALS — BP 161/102 | HR 84 | Ht 70.0 in | Wt 190.0 lb

## 2023-05-14 DIAGNOSIS — N529 Male erectile dysfunction, unspecified: Secondary | ICD-10-CM

## 2023-05-14 DIAGNOSIS — Z125 Encounter for screening for malignant neoplasm of prostate: Secondary | ICD-10-CM | POA: Diagnosis not present

## 2023-05-14 DIAGNOSIS — R399 Unspecified symptoms and signs involving the genitourinary system: Secondary | ICD-10-CM | POA: Diagnosis not present

## 2023-05-14 NOTE — Progress Notes (Signed)
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 Very healthy male who underwent a prostate biopsy November 2020 for an elevated PSA of 7.5 which showed a 50 g prostate with only benign prostate tissue and some chronic inflammation.  He then developed gross hematuria in April 2021  that was evaluated with CT urogram that was normal and a cystoscopy that showed some friable prostate tissue but was otherwise normal.  Cytology was also negative.  A repeat PSA in June 2021 had increased slightly to 8.7(10% free) and we opted for a prostate MRI for further evaluation.  Prostate MRI was performed on 02/28/2020 and showed no suspicious lesions.  Prostate volume was 67 g equating to a PSA density of 0.11.  PSA has been stable since that time.  Most recent PSA from September 2024 decreased to 5.6(18% free), which is his lowest value over the last few years.  He denies any changes over the last year.  He has minimal to no urinary symptoms, and only urinary complaint is occasional urgency.  This seems to be exacerbated by caffeine or running water.  He denies any episodes of gross hematuria.  He has ED that is responsive to 60 mg sildenafil on demand, prescribed by PCP.  We reviewed the risks and benefits of PSA screening, and the controversies surrounding PSA.  With his extensive history of mildly elevated PSA and persistent negative work-up, I recommended continuing PSA on a yearly basis.  We reviewed reassuring findings of stable PSA, low PSA density, negative prostate MRI, negative prostate biopsy.  He would like to have PSA with PCP which is very reasonable, can refer back to urology if significant change from prior values.   -Continue yearly PSA screening with PCP, can refer back to urology if significant change  from prior values (>10)   Sondra Come, MD  [facility] [address], [address] [place], [place] [address] [phone]

## 2023-05-14 NOTE — Patient Instructions (Signed)
Prostate Cancer Screening  Prostate cancer screening is testing that is done to check for the presence of prostate cancer in men. The prostate gland is a walnut-sized gland that is located below the bladder and in front of the rectum in males. The function of the prostate is to add fluid to semen during ejaculation. Prostate cancer is one of the most common types of cancer in men. Who should have prostate cancer screening? Screening recommendations vary based on age and other risk factors, as well as between the professional organizations who make the recommendations. In general, screening is recommended if: You are age 58 to 47 and have an average risk for prostate cancer. You should talk with your health care provider about your need for screening and how often screening should be done. Because most prostate cancers are slow growing and will not cause death, screening in this age group is generally reserved for men who have a 10- to 15-year life expectancy. You are younger than age 54, and you have these risk factors: Having a father, brother, or uncle who has been diagnosed with prostate cancer. The risk is higher if your family member's cancer occurred at an early age or if you have multiple family members with prostate cancer at an early age. Being a male who is [place] or is of [place] or sub-Saharan African descent. In general, screening is not recommended if: You are younger than age 46. You are between the ages of 4 and 68 and you have no risk factors. You are 57 years of age or older. At this age, the risks that screening can cause are greater than the benefits that it may provide. If you are at high risk for prostate cancer, your health care provider may recommend that you have screenings more often or that you start screening at a younger age. How is screening for prostate cancer done? The recommended prostate cancer screening test is a blood test called the prostate-specific antigen  (PSA) test. PSA is a protein that is made in the prostate. As you age, your prostate naturally produces more PSA. Abnormally high PSA levels may be caused by: Prostate cancer. An enlarged prostate that is not caused by cancer (benign prostatic hyperplasia, or BPH). This condition is very common in older men. A prostate gland infection (prostatitis) or urinary tract infection. Certain medicines such as male hormones (like testosterone) or other medicines that raise testosterone levels. A rectal exam may be done as part of prostate cancer screening to help provide information about the size of your prostate gland. When a rectal exam is performed, it should be done after the PSA level is drawn to avoid any effect on the results. Depending on the PSA results, you may need more tests, such as: A physical exam to check the size of your prostate gland, if not done as part of screening. Blood and imaging tests. A procedure to remove tissue samples from your prostate gland for testing (biopsy). This is the only way to know for certain if you have prostate cancer. What are the benefits of prostate cancer screening? Screening can help to identify cancer at an early stage, before symptoms start and when the cancer can be treated more easily. There is a small chance that screening may lower your risk of dying from prostate cancer. The chance is small because prostate cancer is a slow-growing cancer, and most men with prostate cancer die from a different cause. What are the risks of prostate cancer screening? The  main risk of prostate cancer screening is diagnosing and treating prostate cancer that would never have caused any symptoms or problems. This is called overdiagnosisand overtreatment. PSA screening cannot tell you if your PSA is high due to cancer or a different cause. A prostate biopsy is the only procedure to diagnose prostate cancer. Even the results of a biopsy may not tell you if your cancer needs to  be treated. Slow-growing prostate cancer may not need any treatment other than monitoring, so diagnosing and treating it may cause unnecessary stress or other side effects. Questions to ask your health care provider When should I start prostate cancer screening? What is my risk for prostate cancer? How often do I need screening? What type of screening tests do I need? How do I get my test results? What do my results mean? Do I need treatment? Where to find more information The American Cancer Society: www.cancer.org American Urological Association: www.auanet.org Contact a health care provider if: You have difficulty urinating. You have pain when you urinate or ejaculate. You have blood in your urine or semen. You have pain in your back or in the area of your prostate. Summary Prostate cancer is a common type of cancer in men. The prostate gland is located below the bladder and in front of the rectum. This gland adds fluid to semen during ejaculation. Prostate cancer screening may identify cancer at an early stage, when the cancer can be treated more easily and is less likely to have spread to other areas of the body. The prostate-specific antigen (PSA) test is the recommended screening test for prostate cancer, but it has associated risks. Discuss the risks and benefits of prostate cancer screening with your health care provider. If you are age 4 or older, the risks that screening can cause are greater than the benefits that it may provide. This information is not intended to replace advice given to you by your health care provider. Make sure you discuss any questions you have with your health care provider. Document Revised: 01/28/2021 Document Reviewed: 01/28/2021 Elsevier Patient Education  2024 [employer].

## 2023-06-19 ENCOUNTER — Other Ambulatory Visit (HOSPITAL_COMMUNITY): Payer: Self-pay

## 2023-06-19 ENCOUNTER — Other Ambulatory Visit: Payer: Self-pay

## 2023-06-19 MED ORDER — AMLODIPINE BESYLATE 10 MG PO TABS
10.0000 mg | ORAL_TABLET | Freq: Every day | ORAL | 1 refills | Status: AC
  Filled 2023-06-19: qty 90, 90d supply, fill #0

## 2023-07-29 DIAGNOSIS — M65311 Trigger thumb, right thumb: Secondary | ICD-10-CM | POA: Insufficient documentation

## 2023-07-29 DIAGNOSIS — M7541 Impingement syndrome of right shoulder: Secondary | ICD-10-CM | POA: Insufficient documentation

## 2023-08-03 ENCOUNTER — Other Ambulatory Visit (HOSPITAL_COMMUNITY): Payer: Self-pay

## 2023-08-06 DIAGNOSIS — H52201 Unspecified astigmatism, right eye: Secondary | ICD-10-CM | POA: Diagnosis not present

## 2023-08-06 DIAGNOSIS — H5231 Anisometropia: Secondary | ICD-10-CM | POA: Diagnosis not present

## 2023-08-06 DIAGNOSIS — H524 Presbyopia: Secondary | ICD-10-CM | POA: Diagnosis not present

## 2023-08-06 DIAGNOSIS — H43813 Vitreous degeneration, bilateral: Secondary | ICD-10-CM | POA: Diagnosis not present

## 2023-08-06 DIAGNOSIS — H2513 Age-related nuclear cataract, bilateral: Secondary | ICD-10-CM | POA: Diagnosis not present

## 2023-09-21 ENCOUNTER — Other Ambulatory Visit (HOSPITAL_COMMUNITY): Payer: Self-pay

## 2023-09-21 DIAGNOSIS — R972 Elevated prostate specific antigen [PSA]: Secondary | ICD-10-CM | POA: Diagnosis not present

## 2023-09-21 DIAGNOSIS — E7800 Pure hypercholesterolemia, unspecified: Secondary | ICD-10-CM | POA: Diagnosis not present

## 2023-09-21 DIAGNOSIS — E039 Hypothyroidism, unspecified: Secondary | ICD-10-CM | POA: Diagnosis not present

## 2023-09-21 DIAGNOSIS — Z0000 Encounter for general adult medical examination without abnormal findings: Secondary | ICD-10-CM | POA: Diagnosis not present

## 2023-09-21 DIAGNOSIS — I10 Essential (primary) hypertension: Secondary | ICD-10-CM | POA: Diagnosis not present

## 2023-09-21 MED ORDER — ROSUVASTATIN CALCIUM 20 MG PO TABS
20.0000 mg | ORAL_TABLET | Freq: Every day | ORAL | 3 refills | Status: AC
  Filled 2023-09-21: qty 90, 90d supply, fill #0

## 2023-09-21 MED ORDER — SILDENAFIL CITRATE 25 MG PO TABS
50.0000 mg | ORAL_TABLET | Freq: Every day | ORAL | 3 refills | Status: AC | PRN
  Filled 2023-09-21: qty 4, 30d supply, fill #0

## 2023-09-21 MED ORDER — OMEPRAZOLE 40 MG PO CPDR
40.0000 mg | DELAYED_RELEASE_CAPSULE | Freq: Every day | ORAL | 3 refills | Status: AC
  Filled 2023-09-21: qty 90, 90d supply, fill #0
  Filled 2024-08-16: qty 90, 90d supply, fill #1

## 2023-09-21 MED ORDER — AMLODIPINE BESYLATE 10 MG PO TABS
10.0000 mg | ORAL_TABLET | Freq: Every day | ORAL | 3 refills | Status: AC
  Filled 2023-09-21: qty 90, 90d supply, fill #0
  Filled 2024-02-17: qty 90, 90d supply, fill #1
  Filled 2024-06-07: qty 90, 90d supply, fill #2
  Filled 2024-09-15: qty 90, 90d supply, fill #3

## 2023-09-21 MED ORDER — LOSARTAN POTASSIUM-HCTZ 100-12.5 MG PO TABS
1.0000 | ORAL_TABLET | Freq: Every day | ORAL | 3 refills | Status: DC
  Filled 2023-09-21: qty 90, 90d supply, fill #0

## 2023-09-21 MED ORDER — LEVOTHYROXINE SODIUM 88 MCG PO TABS
88.0000 ug | ORAL_TABLET | Freq: Every morning | ORAL | 3 refills | Status: DC
  Filled 2023-09-21: qty 90, 90d supply, fill #0
  Filled 2024-01-08: qty 90, 90d supply, fill #1
  Filled 2024-04-05: qty 90, 90d supply, fill #2
  Filled 2024-06-17: qty 90, 90d supply, fill #3

## 2023-09-22 ENCOUNTER — Ambulatory Visit: Payer: PPO | Admitting: Dermatology

## 2023-09-22 ENCOUNTER — Encounter: Payer: Self-pay | Admitting: Dermatology

## 2023-09-22 DIAGNOSIS — Z85828 Personal history of other malignant neoplasm of skin: Secondary | ICD-10-CM

## 2023-09-22 DIAGNOSIS — L570 Actinic keratosis: Secondary | ICD-10-CM

## 2023-09-22 DIAGNOSIS — L821 Other seborrheic keratosis: Secondary | ICD-10-CM

## 2023-09-22 DIAGNOSIS — Z1283 Encounter for screening for malignant neoplasm of skin: Secondary | ICD-10-CM | POA: Diagnosis not present

## 2023-09-22 DIAGNOSIS — D1801 Hemangioma of skin and subcutaneous tissue: Secondary | ICD-10-CM

## 2023-09-22 DIAGNOSIS — W908XXA Exposure to other nonionizing radiation, initial encounter: Secondary | ICD-10-CM | POA: Diagnosis not present

## 2023-09-22 DIAGNOSIS — L820 Inflamed seborrheic keratosis: Secondary | ICD-10-CM | POA: Diagnosis not present

## 2023-09-22 DIAGNOSIS — L239 Allergic contact dermatitis, unspecified cause: Secondary | ICD-10-CM

## 2023-09-22 DIAGNOSIS — L814 Other melanin hyperpigmentation: Secondary | ICD-10-CM

## 2023-09-22 DIAGNOSIS — D229 Melanocytic nevi, unspecified: Secondary | ICD-10-CM

## 2023-09-22 DIAGNOSIS — L578 Other skin changes due to chronic exposure to nonionizing radiation: Secondary | ICD-10-CM

## 2023-09-22 NOTE — Progress Notes (Signed)
 Follow-Up Visit   Subjective  Jerome Joseph is a 68 y.o. male who presents for the following: Skin Cancer Screening and Full Body Skin Exam. Hx of BCC, Hx SCCs. Thinks SCC on left dorsal hand may be coming back. Tx with EDC at last visit.   Has areas of concern at posterior neck, right ear, left forearm. Raised, irritated.   Irritation on finger under wedding band. Comes and goes. Wedding band is gold.   The patient presents for Total-Body Skin Exam (TBSE) for skin cancer screening and mole check. The patient has spots, moles and lesions to be evaluated, some may be new or changing and the patient may have concern these could be cancer.    The following portions of the chart were reviewed this encounter and updated as appropriate: medications, allergies, medical history  Review of Systems:  No other skin or systemic complaints except as noted in HPI or Assessment and Plan.  Objective  Well appearing patient in no apparent distress; mood and affect are within normal limits.  A full examination was performed including scalp, head, eyes, ears, nose, lips, neck, chest, axillae, abdomen, back, buttocks, bilateral upper extremities, bilateral lower extremities, hands, feet, fingers, toes, fingernails, and toenails. All findings within normal limits unless otherwise noted below.   Relevant physical exam findings are noted in the Assessment and Plan.  R forehead x1, R temple x1, R antitragus x1, R antihelix x1, L temple x1, L lat forehead x1, L cheek x3, L dorsal hand x1, L forearm x3, R forearm x1, R upper arm x1, R post neck x1 (16) Erythematous thin papules/macules with gritty scale.  Left Lower Leg x2, L thigh x1, R post calf x1 (4) Erythematous keratotic or waxy stuck-on papule or plaque.  Assessment & Plan   SKIN CANCER SCREENING PERFORMED TODAY.  HISTORY OF BASAL CELL CARCINOMA OF THE SKIN. R lateral nose/medial cheek 1.3 cm inf to medial canthus. - No evidence of recurrence  today - Recommend regular full body skin exams - Recommend daily broad spectrum sunscreen SPF 30+ to sun-exposed areas, reapply every 2 hours as needed.  - Call if any new or changing lesions are noted between office visits  HISTORY OF SQUAMOUS CELL CARCINOMA OF THE SKIN - No evidence of recurrence today - No lymphadenopathy - Recommend regular full body skin exams - Recommend daily broad spectrum sunscreen SPF 30+ to sun-exposed areas, reapply every 2 hours as needed.  - Call if any new or changing lesions are noted between office visits     ACTINIC DAMAGE WITH PRECANCEROUS ACTINIC KERATOSES Counseling for Topical Chemotherapy Management: Patient exhibits: - Severe, confluent actinic changes with pre-cancerous actinic keratoses that is secondary to cumulative UV radiation exposure over time - Condition that is severe; chronic, not at goal. - diffuse scaly erythematous macules and papules with underlying dyspigmentation - Discussed Prescription Field Treatment topical Chemotherapy for Severe, Chronic Confluent Actinic Changes with Pre-Cancerous Actinic Keratoses Field treatment involves treatment of an entire area of skin that has confluent Actinic Changes (Sun/ Ultraviolet light damage) and PreCancerous Actinic Keratoses by method of PhotoDynamic Therapy (PDT) and/or prescription Topical Chemotherapy agents such as 5-fluorouracil , 5-fluorouracil /calcipotriene, and/or imiquimod.  The purpose is to decrease the number of clinically evident and subclinical PreCancerous lesions to prevent progression to development of skin cancer by chemically destroying early precancer changes that may or may not be visible.  It has been shown to reduce the risk of developing skin cancer in the treated area. As  a result of treatment, redness, scaling, crusting, and open sores may occur during treatment course. One or more than one of these methods may be used and may have to be used several times to control,  suppress and eliminate the PreCancerous changes. Discussed treatment course, expected reaction, and possible side effects. - Recommend daily broad spectrum sunscreen SPF 30+ to sun-exposed areas, reapply every 2 hours as needed.  - Staying in the shade or wearing long sleeves, sun glasses (UVA+UVB protection) and wide brim hats (4-inch brim around the entire circumference of the hat) are also recommended. - Call for new or changing lesions.   Patient prefers to use topical field treatment as opposed to PDT.   LENTIGINES, SEBORRHEIC KERATOSES, HEMANGIOMAS - Benign normal skin lesions - Benign-appearing - Call for any changes  MELANOCYTIC NEVI - Tan-brown and/or pink-flesh-colored symmetric macules and papules - Benign appearing on exam today - Observation - Call clinic for new or changing moles - Recommend daily use of broad spectrum spf 30+ sunscreen to sun-exposed areas.   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 Condition is bothersome/symptomatic for patient. Currently flared.   Treatment Plan: Use Mometasone  cream twice daily until smooth.  Can do patch testing if desired  STUCCO KERATOSIS - Stuck-on papules at feet - Benign-appearing - Discussed benign etiology and prognosis. - Observe - Call for any changes     AK (ACTINIC KERATOSIS) (16) R forehead x1, R temple x1, R antitragus x1, R antihelix x1, L temple x1, L lat forehead x1, L cheek x3, L dorsal hand x1, L forearm x3, R forearm x1, R upper arm x1, R post neck x1 (16) Actinic keratoses are precancerous spots that appear secondary to cumulative UV radiation exposure/sun exposure over time. They are chronic with expected duration over 1 year. A portion of actinic keratoses will progress to squamous cell carcinoma of the skin. It is not possible to reliably predict which  spots will progress to skin cancer and so treatment is recommended to prevent development of skin cancer.  Recommend daily broad spectrum sunscreen SPF 30+ to sun-exposed areas, reapply every 2 hours as needed.  Recommend staying in the shade or wearing long sleeves, sun glasses (UVA+UVB protection) and wide brim hats (4-inch brim around the entire circumference of the hat). Call for new or changing lesions. Destruction of lesion - R forehead x1, R temple x1, R antitragus x1, R antihelix x1, L temple x1, L lat forehead x1, L cheek x3, L dorsal hand x1, L forearm x3, R forearm x1, R upper arm x1, R post neck x1 (16) Complexity: simple   Destruction method: cryotherapy   Informed consent: discussed and consent obtained   Timeout:  patient name, date of birth, surgical site, and procedure verified Lesion destroyed using liquid nitrogen: Yes   Region frozen until ice ball extended beyond lesion: Yes   Cryo cycles: 1 or 2. Outcome: patient tolerated procedure well with no complications   Post-procedure details: wound care instructions given   Additional details:  Prior to procedure, discussed risks of blister formation, small wound, skin dyspigmentation, or rare scar following cryotherapy. Recommend Vaseline ointment to treated areas while healing.  INFLAMED SEBORRHEIC KERATOSIS (4) Left Lower Leg x2, L thigh x1, R post calf x1 (4) Symptomatic, irritating, patient would like treated. Destruction of lesion - Left Lower Leg x2, L thigh x1,  R post calf x1 (4) Complexity: simple   Destruction method: cryotherapy   Informed consent: discussed and consent obtained   Timeout:  patient name, date of birth, surgical site, and procedure verified Lesion destroyed using liquid nitrogen: Yes   Region frozen until ice ball extended beyond lesion: Yes   Cryo cycles: 1 or 2. Outcome: patient tolerated procedure well with no complications   Post-procedure details: wound care instructions given   Additional  details:  Prior to procedure, discussed risks of blister formation, small wound, skin dyspigmentation, or rare scar following cryotherapy. Recommend Vaseline ointment to treated areas while healing.  MULTIPLE BENIGN NEVI   LENTIGINES   ACTINIC ELASTOSIS   CHERRY ANGIOMA   SEBORRHEIC KERATOSES    Return in about 6 months (around 03/21/2024) for TBSE, HxSCCs, HxBCC, HxAKs.  I, Jill Parcell, CMA, am acting as scribe for Boneta Sharps, MD.   Documentation: I have reviewed the above documentation for accuracy and completeness, and I agree with the above.  Boneta Sharps, MD

## 2023-09-22 NOTE — Patient Instructions (Signed)

## 2023-09-23 ENCOUNTER — Encounter: Payer: Self-pay | Admitting: Dermatology

## 2023-11-17 ENCOUNTER — Other Ambulatory Visit (HOSPITAL_COMMUNITY): Payer: Self-pay

## 2023-11-18 ENCOUNTER — Other Ambulatory Visit (HOSPITAL_COMMUNITY): Payer: Self-pay

## 2023-12-07 DIAGNOSIS — I10 Essential (primary) hypertension: Secondary | ICD-10-CM | POA: Diagnosis not present

## 2023-12-07 DIAGNOSIS — Z923 Personal history of irradiation: Secondary | ICD-10-CM | POA: Diagnosis not present

## 2023-12-07 DIAGNOSIS — R000 Tachycardia, unspecified: Secondary | ICD-10-CM | POA: Diagnosis not present

## 2023-12-07 DIAGNOSIS — I959 Hypotension, unspecified: Secondary | ICD-10-CM | POA: Diagnosis not present

## 2023-12-07 DIAGNOSIS — A419 Sepsis, unspecified organism: Secondary | ICD-10-CM | POA: Diagnosis not present

## 2023-12-07 DIAGNOSIS — E785 Hyperlipidemia, unspecified: Secondary | ICD-10-CM | POA: Diagnosis not present

## 2023-12-07 DIAGNOSIS — N400 Enlarged prostate without lower urinary tract symptoms: Secondary | ICD-10-CM | POA: Diagnosis not present

## 2023-12-07 DIAGNOSIS — Z634 Disappearance and death of family member: Secondary | ICD-10-CM | POA: Diagnosis not present

## 2023-12-07 DIAGNOSIS — R791 Abnormal coagulation profile: Secondary | ICD-10-CM | POA: Diagnosis not present

## 2023-12-07 DIAGNOSIS — R918 Other nonspecific abnormal finding of lung field: Secondary | ICD-10-CM | POA: Diagnosis not present

## 2023-12-07 DIAGNOSIS — R0902 Hypoxemia: Secondary | ICD-10-CM | POA: Diagnosis not present

## 2023-12-07 DIAGNOSIS — J189 Pneumonia, unspecified organism: Secondary | ICD-10-CM | POA: Diagnosis not present

## 2023-12-07 DIAGNOSIS — R61 Generalized hyperhidrosis: Secondary | ICD-10-CM | POA: Diagnosis not present

## 2023-12-07 DIAGNOSIS — Z8701 Personal history of pneumonia (recurrent): Secondary | ICD-10-CM | POA: Diagnosis not present

## 2023-12-07 DIAGNOSIS — Z85818 Personal history of malignant neoplasm of other sites of lip, oral cavity, and pharynx: Secondary | ICD-10-CM | POA: Diagnosis not present

## 2023-12-07 DIAGNOSIS — Z79890 Hormone replacement therapy (postmenopausal): Secondary | ICD-10-CM | POA: Diagnosis not present

## 2023-12-07 DIAGNOSIS — F419 Anxiety disorder, unspecified: Secondary | ICD-10-CM | POA: Diagnosis not present

## 2023-12-07 DIAGNOSIS — E890 Postprocedural hypothyroidism: Secondary | ICD-10-CM | POA: Diagnosis not present

## 2023-12-07 DIAGNOSIS — B974 Respiratory syncytial virus as the cause of diseases classified elsewhere: Secondary | ICD-10-CM | POA: Diagnosis not present

## 2023-12-07 DIAGNOSIS — R051 Acute cough: Secondary | ICD-10-CM | POA: Diagnosis not present

## 2023-12-07 DIAGNOSIS — R6520 Severe sepsis without septic shock: Secondary | ICD-10-CM | POA: Diagnosis not present

## 2023-12-07 DIAGNOSIS — J9601 Acute respiratory failure with hypoxia: Secondary | ICD-10-CM | POA: Diagnosis not present

## 2023-12-07 DIAGNOSIS — R509 Fever, unspecified: Secondary | ICD-10-CM | POA: Diagnosis not present

## 2023-12-07 DIAGNOSIS — Z20822 Contact with and (suspected) exposure to covid-19: Secondary | ICD-10-CM | POA: Diagnosis not present

## 2023-12-18 DIAGNOSIS — E039 Hypothyroidism, unspecified: Secondary | ICD-10-CM | POA: Diagnosis not present

## 2023-12-18 DIAGNOSIS — Z789 Other specified health status: Secondary | ICD-10-CM | POA: Diagnosis not present

## 2023-12-18 DIAGNOSIS — E7849 Other hyperlipidemia: Secondary | ICD-10-CM | POA: Diagnosis not present

## 2023-12-18 DIAGNOSIS — H6090 Unspecified otitis externa, unspecified ear: Secondary | ICD-10-CM | POA: Diagnosis not present

## 2023-12-18 DIAGNOSIS — I10 Essential (primary) hypertension: Secondary | ICD-10-CM | POA: Diagnosis not present

## 2024-01-08 ENCOUNTER — Other Ambulatory Visit (HOSPITAL_COMMUNITY): Payer: Self-pay

## 2024-02-02 ENCOUNTER — Other Ambulatory Visit: Payer: Self-pay

## 2024-02-02 ENCOUNTER — Other Ambulatory Visit (HOSPITAL_COMMUNITY): Payer: Self-pay

## 2024-02-02 DIAGNOSIS — I10 Essential (primary) hypertension: Secondary | ICD-10-CM | POA: Diagnosis not present

## 2024-02-02 DIAGNOSIS — H6090 Unspecified otitis externa, unspecified ear: Secondary | ICD-10-CM | POA: Diagnosis not present

## 2024-02-02 MED ORDER — VALSARTAN 40 MG PO TABS
40.0000 mg | ORAL_TABLET | Freq: Every day | ORAL | 3 refills | Status: AC
  Filled 2024-02-02: qty 60, 60d supply, fill #0
  Filled 2024-04-26: qty 60, 60d supply, fill #1
  Filled 2024-06-17: qty 60, 60d supply, fill #2
  Filled 2024-08-16: qty 60, 60d supply, fill #3

## 2024-02-12 DIAGNOSIS — M7751 Other enthesopathy of right foot: Secondary | ICD-10-CM | POA: Diagnosis not present

## 2024-02-12 DIAGNOSIS — M7661 Achilles tendinitis, right leg: Secondary | ICD-10-CM | POA: Diagnosis not present

## 2024-02-16 DIAGNOSIS — H6122 Impacted cerumen, left ear: Secondary | ICD-10-CM | POA: Diagnosis not present

## 2024-02-17 ENCOUNTER — Other Ambulatory Visit: Payer: Self-pay

## 2024-02-17 ENCOUNTER — Other Ambulatory Visit (HOSPITAL_COMMUNITY): Payer: Self-pay

## 2024-02-17 MED ORDER — CARVEDILOL 6.25 MG PO TABS
6.2500 mg | ORAL_TABLET | Freq: Two times a day (BID) | ORAL | 3 refills | Status: AC
  Filled 2024-02-17: qty 180, 90d supply, fill #0
  Filled 2024-06-07: qty 180, 90d supply, fill #1
  Filled 2024-09-15: qty 180, 90d supply, fill #2

## 2024-04-05 ENCOUNTER — Other Ambulatory Visit (HOSPITAL_COMMUNITY): Payer: Self-pay

## 2024-04-07 DIAGNOSIS — E7800 Pure hypercholesterolemia, unspecified: Secondary | ICD-10-CM | POA: Diagnosis not present

## 2024-04-07 DIAGNOSIS — R972 Elevated prostate specific antigen [PSA]: Secondary | ICD-10-CM | POA: Diagnosis not present

## 2024-04-07 DIAGNOSIS — E039 Hypothyroidism, unspecified: Secondary | ICD-10-CM | POA: Diagnosis not present

## 2024-04-07 DIAGNOSIS — Z125 Encounter for screening for malignant neoplasm of prostate: Secondary | ICD-10-CM | POA: Diagnosis not present

## 2024-04-07 DIAGNOSIS — E538 Deficiency of other specified B group vitamins: Secondary | ICD-10-CM | POA: Diagnosis not present

## 2024-04-07 DIAGNOSIS — I10 Essential (primary) hypertension: Secondary | ICD-10-CM | POA: Diagnosis not present

## 2024-04-07 DIAGNOSIS — Z0000 Encounter for general adult medical examination without abnormal findings: Secondary | ICD-10-CM | POA: Diagnosis not present

## 2024-04-14 ENCOUNTER — Other Ambulatory Visit (HOSPITAL_COMMUNITY): Payer: Self-pay

## 2024-04-25 ENCOUNTER — Other Ambulatory Visit (HOSPITAL_COMMUNITY): Payer: Self-pay

## 2024-04-26 ENCOUNTER — Other Ambulatory Visit (HOSPITAL_COMMUNITY): Payer: Self-pay

## 2024-04-27 DIAGNOSIS — M7661 Achilles tendinitis, right leg: Secondary | ICD-10-CM | POA: Diagnosis not present

## 2024-05-02 DIAGNOSIS — E7800 Pure hypercholesterolemia, unspecified: Secondary | ICD-10-CM | POA: Diagnosis not present

## 2024-05-02 DIAGNOSIS — Z125 Encounter for screening for malignant neoplasm of prostate: Secondary | ICD-10-CM | POA: Diagnosis not present

## 2024-05-02 DIAGNOSIS — R799 Abnormal finding of blood chemistry, unspecified: Secondary | ICD-10-CM | POA: Diagnosis not present

## 2024-05-02 DIAGNOSIS — E538 Deficiency of other specified B group vitamins: Secondary | ICD-10-CM | POA: Diagnosis not present

## 2024-05-02 DIAGNOSIS — E039 Hypothyroidism, unspecified: Secondary | ICD-10-CM | POA: Diagnosis not present

## 2024-05-02 DIAGNOSIS — I10 Essential (primary) hypertension: Secondary | ICD-10-CM | POA: Diagnosis not present

## 2024-05-03 ENCOUNTER — Encounter: Payer: PPO | Admitting: Dermatology

## 2024-05-19 DIAGNOSIS — H43813 Vitreous degeneration, bilateral: Secondary | ICD-10-CM | POA: Diagnosis not present

## 2024-05-19 DIAGNOSIS — H2513 Age-related nuclear cataract, bilateral: Secondary | ICD-10-CM | POA: Diagnosis not present

## 2024-06-03 NOTE — Progress Notes (Addendum)
 Jerome Joseph                                          MRN: [id number]   06/03/2024   The [facility] Quality Team Specialist reviewed this patient medical record for the purposes of chart review for care gap closure. The following were reviewed: abstraction for care gap closure-controlling blood pressure.  07/22/2024- NO NEW CBP SINCE PREVIOUSLY CLOSED    [facility] Quality Team

## 2024-06-07 ENCOUNTER — Other Ambulatory Visit (HOSPITAL_COMMUNITY): Payer: Self-pay

## 2024-06-17 ENCOUNTER — Other Ambulatory Visit (HOSPITAL_COMMUNITY): Payer: Self-pay

## 2024-08-16 ENCOUNTER — Other Ambulatory Visit (HOSPITAL_COMMUNITY): Payer: Self-pay

## 2024-08-23 ENCOUNTER — Ambulatory Visit: Admitting: Dermatology

## 2024-08-23 ENCOUNTER — Encounter: Payer: Self-pay | Admitting: Dermatology

## 2024-08-23 DIAGNOSIS — L821 Other seborrheic keratosis: Secondary | ICD-10-CM

## 2024-08-23 DIAGNOSIS — L219 Seborrheic dermatitis, unspecified: Secondary | ICD-10-CM

## 2024-08-23 DIAGNOSIS — L820 Inflamed seborrheic keratosis: Secondary | ICD-10-CM

## 2024-08-23 DIAGNOSIS — Z79899 Other long term (current) drug therapy: Secondary | ICD-10-CM

## 2024-08-23 DIAGNOSIS — Z85828 Personal history of other malignant neoplasm of skin: Secondary | ICD-10-CM | POA: Diagnosis not present

## 2024-08-23 DIAGNOSIS — L814 Other melanin hyperpigmentation: Secondary | ICD-10-CM | POA: Diagnosis not present

## 2024-08-23 DIAGNOSIS — D1801 Hemangioma of skin and subcutaneous tissue: Secondary | ICD-10-CM | POA: Diagnosis not present

## 2024-08-23 DIAGNOSIS — W908XXA Exposure to other nonionizing radiation, initial encounter: Secondary | ICD-10-CM | POA: Diagnosis not present

## 2024-08-23 DIAGNOSIS — Z5111 Encounter for antineoplastic chemotherapy: Secondary | ICD-10-CM

## 2024-08-23 DIAGNOSIS — Z7189 Other specified counseling: Secondary | ICD-10-CM

## 2024-08-23 DIAGNOSIS — Z1283 Encounter for screening for malignant neoplasm of skin: Secondary | ICD-10-CM | POA: Diagnosis not present

## 2024-08-23 DIAGNOSIS — L578 Other skin changes due to chronic exposure to nonionizing radiation: Secondary | ICD-10-CM | POA: Diagnosis not present

## 2024-08-23 DIAGNOSIS — L570 Actinic keratosis: Secondary | ICD-10-CM | POA: Diagnosis not present

## 2024-08-23 DIAGNOSIS — Z8589 Personal history of malignant neoplasm of other organs and systems: Secondary | ICD-10-CM

## 2024-08-23 DIAGNOSIS — D229 Melanocytic nevi, unspecified: Secondary | ICD-10-CM

## 2024-08-23 NOTE — Patient Instructions (Signed)
 Start October 02 2024  - Start 5-fluorouracil /calcipotriene cream twice a day for 10 days to affected areas including bilateral hands . Prescription sent to [facility]. Patient advised they will receive an email to purchase the medication online and have it sent to their home. Patient provided with handout reviewing treatment course and side effects and advised to call or message us  on MyChart with any concerns.  Reviewed course of treatment and expected reaction.  Patient advised to expect inflammation and crusting and advised that erosions are possible.  Patient advised to be diligent with sun protection during and after treatment. Counseled to keep medication out of reach of children and pets.  Instructions for [facility]  One or more of your medications was sent to the [facility] order [facility] pharmacy. You will receive an email from them and can purchase the medicine through that link. It will then be mailed to your home at the address you confirmed. If for any reason you do not receive an email from them, please check your spam folder. If you still do not find the email, please let us  know. [facility] phone number is [phone].    5-Fluorouracil /Calcipotriene Patient Education   Actinic keratoses are the dry, red scaly spots on the skin caused by sun damage. A portion of these spots can turn into skin cancer with time, and treating them can help prevent development of skin cancer.   Treatment of these spots requires removal of the defective skin cells. There are various ways to remove actinic keratoses, including freezing with liquid nitrogen, treatment with creams, or treatment with a blue light procedure in the office.   5-fluorouracil  cream is a topical cream used to treat actinic keratoses. It works by interfering with the growth of abnormal fast-growing skin cells, such as actinic keratoses. These cells peel off and are  replaced by healthy ones.   5-fluorouracil /calcipotriene is a combination of the 5-fluorouracil  cream with a vitamin D analog cream called calcipotriene. The calcipotriene alone does not treat actinic keratoses. However, when it is combined with 5-fluorouracil , it helps the 5-fluorouracil  treat the actinic keratoses much faster so that the same results can be achieved with a much shorter treatment time.  INSTRUCTIONS FOR 5-FLUOROURACIL /CALCIPOTRIENE CREAM:   5-fluorouracil /calcipotriene cream typically only needs to be used for 4-7 days. A thin layer should be applied twice a day to the treatment areas recommended by your physician.   If your physician prescribed you separate tubes of 5-fluourouracil and calcipotriene, apply a thin layer of 5-fluorouracil  followed by a thin layer of calcipotriene.   Avoid contact with your eyes, nostrils, and mouth. Do not use 5-fluorouracil /calcipotriene cream on infected or open wounds.   You will develop redness, irritation and some crusting at areas where you have pre-cancer damage/actinic keratoses. IF YOU DEVELOP PAIN, BLEEDING, OR SIGNIFICANT CRUSTING, STOP THE TREATMENT EARLY - you have already gotten a good response and the actinic keratoses should clear up well.  Wash your hands after applying 5-fluorouracil  5% cream on your skin.   A moisturizer or sunscreen with a minimum SPF 30 should be applied each morning.   Once you have finished the treatment, you can apply a thin layer of Vaseline twice a day to irritated areas to soothe and calm the areas more quickly. If you experience significant discomfort, contact your physician.  For some patients it is necessary to repeat the treatment for best results.  SIDE EFFECTS: When using 5-fluorouracil /calcipotriene cream, you may have  mild irritation, such as redness, dryness, swelling, or a mild burning sensation. This usually resolves within 2 weeks. The more actinic keratoses you have, the more redness and  inflammation you can expect during treatment. Eye irritation has been reported rarely. If this occurs, please let us  know.  If you have any trouble using this cream, please call the office. If you have any other questions about this information, please do not hesitate to ask me before you leave the office.    Melanoma ABCDEs  Melanoma is the most dangerous type of skin cancer, and is the leading cause of death from skin disease.  You are more likely to develop melanoma if you: Have light-colored skin, light-colored eyes, or red or blond hair Spend a lot of time in the sun Tan regularly, either outdoors or in a tanning bed Have had blistering sunburns, especially during childhood Have a close family member who has had a melanoma Have atypical moles or large birthmarks  Early detection of melanoma is key since treatment is typically straightforward and cure rates are extremely high if we catch it early.   The first sign of melanoma is often a change in a mole or a new dark spot.  The ABCDE system is a way of remembering the signs of melanoma.  A for asymmetry:  The two halves do not match. B for border:  The edges of the growth are irregular. C for color:  A mixture of colors are present instead of an even brown color. D for diameter:  Melanomas are usually (but not always) greater than 6mm - the size of a pencil eraser. E for evolution:  The spot keeps changing in size, shape, and color.  Please check your skin once per month between visits. You can use a small mirror in front and a large mirror behind you to keep an eye on the back side or your body.   If you see any new or changing lesions before your next follow-up, please call to schedule a visit.  Please continue daily skin protection including broad spectrum sunscreen SPF 30+ to sun-exposed areas, reapplying every 2 hours as needed when you're outdoors.   Staying in the shade or wearing long sleeves, sun glasses (UVA+UVB  protection) and wide brim hats (4-inch brim around the entire circumference of the hat) are also recommended for sun protection.     Due to recent changes in healthcare laws, you may see results of your pathology and/or laboratory studies on MyChart before the doctors have had a chance to review them. We understand that in some cases there may be results that are confusing or concerning to you. Please understand that not all results are received at the same time and often the doctors may need to interpret multiple results in order to provide you with the best plan of care or course of treatment. Therefore, we ask that you please give us  2 business days to thoroughly review all your results before contacting the office for clarification. Should we see a critical lab result, you will be contacted sooner.   If You Need Anything After Your Visit  If you have any questions or concerns for your doctor, please call our main line at [phone] and press option 4 to reach your doctor's medical assistant. If no one answers, please leave a voicemail as directed and we will return your call as soon as possible. Messages left after 4 pm will be answered the following business day.   You  may also send us  a message via MyChart. We typically respond to MyChart messages within 1-2 business days.  For prescription refills, please ask your pharmacy to contact our office. Our fax number is [phone].  If you have an urgent issue when the clinic is closed that cannot wait until the next business day, you can page your doctor at the number below.    Please note that while we do our best to be available for urgent issues outside of office hours, we are not available 24/7.   If you have an urgent issue and are unable to reach us , you may choose to seek medical care at your doctor's office, retail clinic, urgent care center, or emergency room.  If you have a medical emergency, please immediately call 911 or go to  the emergency department.  Pager Numbers  - Dr. Hester: [phone]  - Dr. Jackquline: [phone]  - Dr. Claudene: [phone]   - Dr. Raymund: [phone]  In the event of inclement weather, please call our main line at [phone] for an update on the status of any delays or closures.  Dermatology Medication Tips: Please keep the boxes that topical medications come in in order to help keep track of the instructions about where and how to use these. Pharmacies typically print the medication instructions only on the boxes and not directly on the medication tubes.   If your medication is too expensive, please contact our office at [phone] option 4 or send us  a message through MyChart.   We are unable to tell what your co-pay for medications will be in advance as this is different depending on your insurance coverage. However, we may be able to find a substitute medication at lower cost or fill out paperwork to get insurance to cover a needed medication.   If a prior authorization is required to get your medication covered by your insurance company, please allow us  1-2 business days to complete this process.  Drug prices often vary depending on where the prescription is filled and some pharmacies may offer cheaper prices.  The website www.goodrx.com contains coupons for medications through different pharmacies. The prices here do not account for what the cost may be with help from insurance (it may be cheaper with your insurance), but the website can give you the price if you did not use any insurance.  - You can print the associated coupon and take it with your prescription to the pharmacy.  - You may also stop by our office during regular business hours and pick up a GoodRx coupon card.  - If you need your prescription sent electronically to a different pharmacy, notify our office through [facility] or by phone at [phone] option 4.     Si Usted Necesita Algo  Despus de Su Visita  Tambin puede enviarnos un mensaje a travs de Clinical Cytogeneticist. Por lo general respondemos a los mensajes de MyChart en el transcurso de 1 a 2 das hbiles.  Para renovar recetas, por favor pida a su farmacia que se ponga en contacto con nuestra oficina. Randi lakes de fax es [place] [phone].  Si tiene un asunto urgente cuando la clnica est cerrada y que no puede esperar hasta el siguiente da hbil, puede llamar/localizar a su doctor(a) al nmero que aparece a continuacin.   Por favor, tenga en cuenta que aunque hacemos todo lo posible para estar disponibles para asuntos urgentes fuera del horario de oficina, no estamos disponibles las 24 horas del da, los 7  das de the tjx companies.   Si tiene un problema urgente y no puede comunicarse con nosotros, puede optar por buscar atencin mdica  en el consultorio de su doctor(a), en una clnica privada, en un centro de atencin urgente o en una sala de emergencias.  Si tiene engineer, drilling, por favor llame inmediatamente al 911 o vaya a la sala de emergencias.  Nmeros de bper  - Dr. Hester: [phone]  - Dra. Jackquline: [phone]  - Dr. Claudene: [phone]  - Dra. Kitts: [phone]  En caso de inclemencias del [place], por favor llame a nuestra lnea principal al [phone] para una actualizacin sobre el estado de cualquier retraso o cierre.  Consejos para la medicacin en dermatologa: Por favor, guarde las cajas en las que vienen los medicamentos de uso tpico para ayudarle a seguir las instrucciones sobre dnde y cmo usarlos. Las farmacias generalmente imprimen las instrucciones del medicamento slo en las cajas y no directamente en los tubos del [place].   Si su medicamento es muy caro, por favor, pngase en contacto con landry rieger llamando al [phone] y presione la opcin 4 o envenos un mensaje a travs de Clinical Cytogeneticist.   No podemos decirle cul ser su copago por los medicamentos por  adelantado ya que esto es diferente dependiendo de la cobertura de su seguro. Sin embargo, es posible que podamos encontrar un medicamento sustituto a audiological scientist un formulario para que el seguro cubra el medicamento que se considera necesario.   Si se requiere una autorizacin previa para que su compaa de seguros cubra su medicamento, por favor permtanos de 1 a 2 das hbiles para completar este proceso.  Los precios de los medicamentos varan con frecuencia dependiendo del environmental consultant de dnde se surte la receta y alguna farmacias pueden ofrecer precios ms baratos.  El sitio web www.goodrx.com tiene cupones para medicamentos de health and safety inspector. Los precios aqu no tienen en cuenta lo que podra costar con la ayuda del seguro (puede ser ms barato con su seguro), pero el sitio web puede darle el precio si no utiliz tourist information centre manager.  - Puede imprimir el cupn correspondiente y llevarlo con su receta a la farmacia.  - Tambin puede pasar por nuestra oficina durante el horario de atencin regular y education officer, museum una tarjeta de cupones de GoodRx.  - Si necesita que su receta se enve electrnicamente a una farmacia diferente, informe a nuestra oficina a travs de MyChart de [place] o por telfono llamando al [phone] y presione la opcin 4.

## 2024-08-23 NOTE — Progress Notes (Signed)
 "  Follow-Up Visit   Subjective  Jerome Joseph is a 69 y.o. male who presents for the following: Skin Cancer Screening and Full Body Skin Exam Hx of bcc, hx of scc Hx of aks and isks  Hx of atopic / nummular dermatitis  Mometasone  cream Hx of throat cancer   Isk left lateral neck left shoulder left chest right ankle  Left lower leg  Ak forehead   Cream in feb 15 use cream bid for 2 weeks  At hands  Hands and face  Photo of birth mark left buttock   The patient presents for Total-Body Skin Exam (TBSE) for skin cancer screening and mole check. The patient has spots, moles and lesions to be evaluated, some may be new or changing and the patient may have concern these could be cancer.  The following portions of the chart were reviewed this encounter and updated as appropriate: medications, allergies, medical history  Review of Systems:  No other skin or systemic complaints except as noted in HPI or Assessment and Plan.  Objective  Well appearing patient in no apparent distress; mood and affect are within normal limits.  A full examination was performed including scalp, head, eyes, ears, nose, lips, neck, chest, axillae, abdomen, back, buttocks, bilateral upper extremities, bilateral lower extremities, hands, feet, fingers, toes, fingernails, and toenails. All findings within normal limits unless otherwise noted below.   Relevant physical exam findings are noted in the Assessment and Plan.  face and hands and neck x 28 (28) Erythematous thin papules/macules with gritty scale.  right ankle x 1, left lower leg x 1, left lateral neck x 1, left shoulder x 1, left chest x 1 (5) Erythematous stuck-on, waxy papule or plaque  Assessment & Plan   HISTORY OF BASAL CELL CARCINOMA OF THE SKIN. R lateral nose/medial cheek 1.3 cm inf to medial canthus. - No evidence of recurrence today - Recommend regular full body skin exams - Recommend daily broad spectrum sunscreen SPF 30+ to sun-exposed  areas, reapply every 2 hours as needed.  - Call if any new or changing lesions are noted between office visits   HISTORY OF SQUAMOUS CELL CARCINOMA OF THE SKIN - No evidence of recurrence today - No lymphadenopathy - Recommend regular full body skin exams - Recommend daily broad spectrum sunscreen SPF 30+ to sun-exposed areas, reapply every 2 hours as needed.  - Call if any new or changing lesions are noted between office visits   SKIN CANCER SCREENING PERFORMED TODAY.  ACTINIC DAMAGE - Chronic condition, secondary to cumulative UV/sun exposure - diffuse scaly erythematous macules with underlying dyspigmentation - Recommend daily broad spectrum sunscreen SPF 30+ to sun-exposed areas, reapply every 2 hours as needed.  - Staying in the shade or wearing long sleeves, sun glasses (UVA+UVB protection) and wide brim hats (4-inch brim around the entire circumference of the hat) are also recommended for sun protection.  - Call for new or changing lesions.  LENTIGINES, SEBORRHEIC KERATOSES, HEMANGIOMAS - Benign normal skin lesions - Benign-appearing - Call for any changes  MELANOCYTIC NEVI - Tan-brown and/or pink-flesh-colored symmetric macules and papules - Benign appearing on exam today - Observation - Call clinic for new or changing moles - Recommend daily use of broad spectrum spf 30+ sunscreen to sun-exposed areas.   Hxof hand dermatitis use mometasone   Will not send in    SEBORRHEIC DERMATITIS Exam: Pink patches with greasy scale at glabella & nasolabial areas Chronic and persistent condition with duration or expected  duration over one year. Condition is symptomatic / bothersome to patient. Not to goal. Seborrheic Dermatitis is a chronic persistent rash characterized by pinkness and scaling most commonly of the mid face but also can occur on the scalp (dandruff), ears; mid chest, mid back and groin.  It tends to be exacerbated by stress and cooler weather.  People who have  neurologic disease may experience new onset or exacerbation of existing seborrheic dermatitis.  The condition is not curable but treatable and can be controlled. Treatment Plan: Ketoconazole  cream at bedtime 3-5 days per week aa   ACTINIC KERATOSIS (28) face and hands and neck x 28 (28) Start cream February 15 - Start 5-fluorouracil /calcipotriene cream twice a day for 10 days to affected areas including b/l hands. Prescription sent to [facility]. Patient advised they will receive an email to purchase the medication online and have it sent to their home. Patient provided with handout reviewing treatment course and side effects and advised to call or message us  on MyChart with any concerns.  Reviewed course of treatment and expected reaction.  Patient advised to expect inflammation and crusting and advised that erosions are possible.  Patient advised to be diligent with sun protection during and after treatment. Counseled to keep medication out of reach of children and pets.    Actinic keratoses are precancerous spots that appear secondary to cumulative UV radiation exposure/sun exposure over time. They are chronic with expected duration over 1 year. A portion of actinic keratoses will progress to squamous cell carcinoma of the skin. It is not possible to reliably predict which spots will progress to skin cancer and so treatment is recommended to prevent development of skin cancer.  Recommend daily broad spectrum sunscreen SPF 30+ to sun-exposed areas, reapply every 2 hours as needed.  Recommend staying in the shade or wearing long sleeves, sun glasses (UVA+UVB protection) and wide brim hats (4-inch brim around the entire circumference of the hat). Call for new or changing lesions. - Destruction of lesion - face and hands and neck x 28 (28) Complexity: simple   Destruction method: cryotherapy   Informed consent: discussed and consent obtained   Timeout:  patient name,  date of birth, surgical site, and procedure verified Lesion destroyed using liquid nitrogen: Yes   Region frozen until ice ball extended beyond lesion: Yes   Outcome: patient tolerated procedure well with no complications   Post-procedure details: wound care instructions given    INFLAMED SEBORRHEIC KERATOSIS (5) right ankle x 1, left lower leg x 1, left lateral neck x 1, left shoulder x 1, left chest x 1 (5) Symptomatic, irritating, patient would like treated. - Destruction of lesion - right ankle x 1, left lower leg x 1, left lateral neck x 1, left shoulder x 1, left chest x 1 (5) Complexity: simple   Destruction method: cryotherapy   Informed consent: discussed and consent obtained   Timeout:  patient name, date of birth, surgical site, and procedure verified Lesion destroyed using liquid nitrogen: Yes   Region frozen until ice ball extended beyond lesion: Yes   Outcome: patient tolerated procedure well with no complications   Post-procedure details: wound care instructions given    Return in about 6 months (around 02/20/2025) for TBSE.  IEleanor Blush, CMA, am acting as scribe for Alm Rhyme, MD.   Documentation: I have reviewed the above documentation for accuracy and completeness, and I agree with the above.  Alm Rhyme, MD     "

## 2024-08-29 ENCOUNTER — Other Ambulatory Visit (HOSPITAL_COMMUNITY): Payer: Self-pay

## 2024-09-15 ENCOUNTER — Other Ambulatory Visit (HOSPITAL_COMMUNITY): Payer: Self-pay

## 2024-09-18 ENCOUNTER — Other Ambulatory Visit (HOSPITAL_COMMUNITY): Payer: Self-pay

## 2024-09-18 MED ORDER — LEVOTHYROXINE SODIUM 88 MCG PO TABS
88.0000 ug | ORAL_TABLET | Freq: Every morning | ORAL | 1 refills | Status: AC
  Filled 2024-09-18: qty 90, 90d supply, fill #0

## 2024-09-19 ENCOUNTER — Other Ambulatory Visit (HOSPITAL_COMMUNITY): Payer: Self-pay

## 2025-03-02 ENCOUNTER — Ambulatory Visit: Admitting: Dermatology
